# Patient Record
Sex: Female | Born: 1985 | Race: Black or African American | Hispanic: No | Marital: Single | Smoking: Never smoker
Health system: Southern US, Community
[De-identification: ages and names within clinical notes are randomized; demographics above are authoritative.]

## PROBLEM LIST (undated history)

## (undated) DIAGNOSIS — D649 Anemia, unspecified: Secondary | ICD-10-CM

## (undated) DIAGNOSIS — M1990 Unspecified osteoarthritis, unspecified site: Secondary | ICD-10-CM

## (undated) DIAGNOSIS — R011 Cardiac murmur, unspecified: Secondary | ICD-10-CM

---

## 2009-08-24 ENCOUNTER — Ambulatory Visit (HOSPITAL_COMMUNITY): Admission: RE | Admit: 2009-08-24 | Discharge: 2009-08-24 | Payer: Self-pay | Admitting: Obstetrics & Gynecology

## 2010-01-09 ENCOUNTER — Inpatient Hospital Stay (HOSPITAL_COMMUNITY): Admission: AD | Admit: 2010-01-09 | Discharge: 2010-01-11 | Payer: Self-pay | Admitting: Obstetrics

## 2010-01-12 ENCOUNTER — Ambulatory Visit: Admission: RE | Admit: 2010-01-12 | Discharge: 2010-01-12 | Payer: Self-pay | Admitting: Obstetrics & Gynecology

## 2010-02-04 ENCOUNTER — Inpatient Hospital Stay (HOSPITAL_COMMUNITY): Admission: AD | Admit: 2010-02-04 | Discharge: 2010-02-04 | Payer: Self-pay | Admitting: Obstetrics & Gynecology

## 2010-02-04 ENCOUNTER — Ambulatory Visit: Payer: Self-pay | Admitting: Obstetrics and Gynecology

## 2011-02-27 LAB — CBC
HCT: 38.6 % (ref 36.0–46.0)
Hemoglobin: 13.1 g/dL (ref 12.0–15.0)
MCHC: 33.6 g/dL (ref 30.0–36.0)
MCHC: 33.9 g/dL (ref 30.0–36.0)
MCV: 88.7 fL (ref 78.0–100.0)
MCV: 89.2 fL (ref 78.0–100.0)
MCV: 90.3 fL (ref 78.0–100.0)
Platelets: 183 10*3/uL (ref 150–400)
Platelets: 198 10*3/uL (ref 150–400)
Platelets: 227 10*3/uL (ref 150–400)
RBC: 3.96 MIL/uL (ref 3.87–5.11)
RBC: 4.3 MIL/uL (ref 3.87–5.11)
RDW: 13.3 % (ref 11.5–15.5)
RDW: 13.6 % (ref 11.5–15.5)
WBC: 7.1 10*3/uL (ref 4.0–10.5)

## 2012-08-05 ENCOUNTER — Encounter (HOSPITAL_BASED_OUTPATIENT_CLINIC_OR_DEPARTMENT_OTHER): Payer: Self-pay | Admitting: *Deleted

## 2012-08-05 ENCOUNTER — Emergency Department (HOSPITAL_BASED_OUTPATIENT_CLINIC_OR_DEPARTMENT_OTHER)
Admission: EM | Admit: 2012-08-05 | Discharge: 2012-08-05 | Disposition: A | Discharge status: Home / Self Care | Payer: Self-pay | Attending: Emergency Medicine | Admitting: Emergency Medicine

## 2012-08-05 DIAGNOSIS — R51 Headache: Secondary | ICD-10-CM | POA: Insufficient documentation

## 2012-08-05 LAB — COMPREHENSIVE METABOLIC PANEL
ALT: 11 U/L (ref 0–35)
AST: 18 U/L (ref 0–37)
CO2: 27 mEq/L (ref 19–32)
Chloride: 104 mEq/L (ref 96–112)
Creatinine, Ser: 0.7 mg/dL (ref 0.50–1.10)
GFR calc Af Amer: >90 mL/min (ref >90)
GFR calc non Af Amer: >90 mL/min (ref >90)
Glucose, Bld: 122 mg/dL — ABNORMAL HIGH (ref 70–99)
Sodium: 138 mEq/L (ref 135–145)
Total Bilirubin: 0.2 mg/dL — ABNORMAL LOW (ref 0.3–1.2)

## 2012-08-05 LAB — URINALYSIS, ROUTINE W REFLEX MICROSCOPIC
Glucose, UA: NEGATIVE mg/dL
Hgb urine dipstick: NEGATIVE
Ketones, ur: NEGATIVE mg/dL
Protein, ur: NEGATIVE mg/dL

## 2012-08-05 LAB — CBC WITH DIFFERENTIAL/PLATELET
Basophils Absolute: 0 10*3/uL (ref 0.0–0.1)
HCT: 34.9 % — ABNORMAL LOW (ref 36.0–46.0)
Lymphocytes Relative: 27 % (ref 12–46)
Lymphs Abs: 2.5 10*3/uL (ref 0.7–4.0)
MCV: 79.9 fL (ref 78.0–100.0)
Monocytes Absolute: 0.8 10*3/uL (ref 0.1–1.0)
Neutro Abs: 5.7 10*3/uL (ref 1.7–7.7)
RBC: 4.37 MIL/uL (ref 3.87–5.11)
RDW: 14.5 % (ref 11.5–15.5)
WBC: 9.2 10*3/uL (ref 4.0–10.5)

## 2012-08-05 MED ORDER — IBUPROFEN 800 MG PO TABS: 800.0000 mg | ORAL_TABLET | Freq: Three times a day (TID) | ORAL | Status: AC

## 2012-08-05 MED ORDER — SODIUM CHLORIDE 0.9 % IV BOLUS (SEPSIS)
1000.0000 mL | Freq: Once | INTRAVENOUS | Status: AC
  Administered 2012-08-05: 1000 mL via INTRAVENOUS

## 2012-08-05 NOTE — ED Notes (Signed)
Pt c/o h/a x 2 days 

## 2012-08-07 NOTE — ED Provider Notes (Signed)
History     CSN: [id number]  Arrival date & time 08/05/12  1724   First MD Initiated Contact with Patient 08/05/12 1758      Chief Complaint  Patient presents with  . Headache    (Consider location/radiation/quality/duration/timing/severity/associated sxs/prior treatment) HPI Patient is a 67 roll female who presents today with 2 days of intermittent headaches which she reports is currently 2/10. She has tried Aleve without relief at home. She denies nausea, vomiting, photophobia, phonophobia, numbness, tingling, paresthesias, or rash. Patient has had no fever. She endorses some lightheadedness. She does report some generalized malaise as well. She describes some lightheadedness but denies any chest pain or shortness of breath. Patient has continued to eat and drink well. She is concerned as the headache keeps coming back. Patient denies any abdominal pain, chest pain, cough, shortness of breath. There no other associated or modifying factors. History reviewed. No pertinent past medical history.  History reviewed. No pertinent past surgical history.  History reviewed. No pertinent family history.  History  Substance Use Topics  . Smoking status: Never Smoker   . Smokeless tobacco: Not on file  . Alcohol Use: No    OB History    Grav Para Term Preterm Abortions TAB SAB Ect Mult Living                  Review of Systems  Constitutional: Positive for fatigue.  Eyes: Negative.   Respiratory: Negative.   Cardiovascular: Negative.   Gastrointestinal: Negative.   Genitourinary: Negative.   Musculoskeletal: Negative.   Neurological: Positive for light-headedness and headaches.  Hematological: Negative.   Psychiatric/Behavioral: Negative.   All other systems reviewed and are negative.    Allergies  Review of patient's allergies indicates no known allergies.  Home Medications   Current Outpatient Rx  Name Route Sig Dispense Refill  . NAPROXEN SODIUM 220 MG PO TABS Oral  Take 440 mg by mouth 2 (two) times daily with a meal. For headache.    . IBUPROFEN 800 MG PO TABS Oral Take 1 tablet (800 mg total) by mouth 3 (three) times daily. 21 tablet 0    BP 113/59  Pulse 74  Temp 98.5 F (36.9 C) (Oral)  Resp 18  Ht 5\' 9"  (1.753 m)  Wt 216 lb (97.977 kg)  BMI 31.90 kg/m2  SpO2 100%  LMP 08/01/2012  Physical Exam  Nursing note and vitals reviewed. GEN: Well-developed, well-nourished female in no distress HEENT: Atraumatic, normocephalic. Oropharynx clear without erythema EYES: PERRLA BL, no scleral icterus. NECK: Trachea midline, no meningismus CV: regular rate and rhythm. No murmurs, rubs, or gallops PULM: No respiratory distress.  No crackles, wheezes, or rales. GI: soft, non-tender. No guarding, rebound, or tenderness. + bowel sounds  GU: deferred Neuro: cranial nerves grossly 2-12 intact, no abnormalities of strength or sensation, A and O x 3, no pronator drift, no dysmetria on finger to nose test bilaterally. MSK: Patient moves all 4 extremities symmetrically, no deformity, edema, or injury noted Skin: No rashes petechiae, purpura, or jaundice Psych: no abnormality of mood   ED Course  Procedures (including critical care time)  Labs Reviewed  CBC WITH DIFFERENTIAL - Abnormal; Notable for the following:    Hemoglobin 11.3 (*)     HCT 34.9 (*)     MCH 25.9 (*)     All other components within normal limits  COMPREHENSIVE METABOLIC PANEL - Abnormal; Notable for the following:    Glucose, Bld 122 (*)  Total Bilirubin 0.2 (*)     All other components within normal limits  LIPASE, BLOOD  URINALYSIS, ROUTINE W REFLEX MICROSCOPIC  PREGNANCY, URINE   No results found.   1. Headache       MDM  Patient was evaluated by myself. Check completely nonfocal neurologic exam and headache was only a 2/10. Patient was concerned that she had had some lightheadedness and was having recurrence of her headaches. Patient did have a urinalysis and urine  pregnancy both of which were unremarkable given her nonspecific symptoms. Additionally as she mentioned some nausea and general malaise she did have a conference of metabolic panel and lipase are unremarkable. CBC showed only a slight anemia consistent with patient may demonstrate female. Patient did receive a liter normal saline IV bolus and felt better after this. She was instructed to use ibuprofen if her headache returns. No further evaluation was necessary today. Patient was discharged in good condition in provided with resource list obtain a primary care physician to followup with.        Cyndra Numbers, MD 08/07/12 [phone]

## 2012-09-05 ENCOUNTER — Emergency Department (HOSPITAL_BASED_OUTPATIENT_CLINIC_OR_DEPARTMENT_OTHER): Payer: Self-pay

## 2012-09-05 ENCOUNTER — Encounter (HOSPITAL_BASED_OUTPATIENT_CLINIC_OR_DEPARTMENT_OTHER): Payer: Self-pay | Admitting: *Deleted

## 2012-09-05 ENCOUNTER — Ambulatory Visit (HOSPITAL_BASED_OUTPATIENT_CLINIC_OR_DEPARTMENT_OTHER)
Admission: EM | Admit: 2012-09-05 | Discharge: 2012-09-06 | Disposition: A | Discharge status: Home / Self Care | Payer: Self-pay | Attending: Surgery | Admitting: Surgery

## 2012-09-05 DIAGNOSIS — R358 Other polyuria: Secondary | ICD-10-CM | POA: Insufficient documentation

## 2012-09-05 DIAGNOSIS — K3580 Unspecified acute appendicitis: Secondary | ICD-10-CM | POA: Insufficient documentation

## 2012-09-05 DIAGNOSIS — R3589 Other polyuria: Secondary | ICD-10-CM | POA: Insufficient documentation

## 2012-09-05 DIAGNOSIS — K37 Unspecified appendicitis: Secondary | ICD-10-CM

## 2012-09-05 HISTORY — DX: Anemia, unspecified: D64.9

## 2012-09-05 LAB — URINE MICROSCOPIC-ADD ON

## 2012-09-05 LAB — URINALYSIS, ROUTINE W REFLEX MICROSCOPIC
Bilirubin Urine: NEGATIVE
Glucose, UA: NEGATIVE mg/dL
Hgb urine dipstick: NEGATIVE
Nitrite: NEGATIVE
Specific Gravity, Urine: 1.017 (ref 1.005–1.030)
pH: 7 (ref 5.0–8.0)

## 2012-09-05 LAB — COMPREHENSIVE METABOLIC PANEL
AST: 22 U/L (ref 0–37)
Albumin: 3.7 g/dL (ref 3.5–5.2)
Calcium: 9 mg/dL (ref 8.4–10.5)
Chloride: 102 mEq/L (ref 96–112)
Creatinine, Ser: 0.5 mg/dL (ref 0.50–1.10)
Total Bilirubin: 0.4 mg/dL (ref 0.3–1.2)

## 2012-09-05 LAB — CBC WITH DIFFERENTIAL/PLATELET
Basophils Absolute: 0 10*3/uL (ref 0.0–0.1)
Basophils Relative: 0 % (ref 0–1)
HCT: 38.5 % (ref 36.0–46.0)
MCHC: 33.2 g/dL (ref 30.0–36.0)
Monocytes Absolute: 0.7 10*3/uL (ref 0.1–1.0)
Neutro Abs: 12.3 10*3/uL — ABNORMAL HIGH (ref 1.7–7.7)
Platelets: 250 10*3/uL (ref 150–400)
RDW: 14.9 % (ref 11.5–15.5)

## 2012-09-05 MED ORDER — ONDANSETRON HCL 4 MG/2ML IJ SOLN
4.0000 mg | Freq: Once | INTRAMUSCULAR | Status: AC
  Administered 2012-09-05: 4 mg via INTRAVENOUS
  Filled 2012-09-05: qty 2

## 2012-09-05 MED ORDER — IOHEXOL 300 MG/ML  SOLN
100.0000 mL | Freq: Once | INTRAMUSCULAR | Status: AC | PRN
  Administered 2012-09-05: 100 mL via INTRAVENOUS

## 2012-09-05 MED ORDER — FENTANYL CITRATE 0.05 MG/ML IJ SOLN
50.0000 ug | Freq: Once | INTRAMUSCULAR | Status: AC
  Administered 2012-09-05: 50 ug via INTRAVENOUS
  Filled 2012-09-05: qty 2

## 2012-09-05 MED ORDER — GI COCKTAIL ~~LOC~~
30.0000 mL | Freq: Once | ORAL | Status: AC
  Administered 2012-09-05: 30 mL via ORAL
  Filled 2012-09-05: qty 30

## 2012-09-05 MED ORDER — PIPERACILLIN-TAZOBACTAM 3.375 G IVPB 30 MIN
3.3750 g | Freq: Once | INTRAVENOUS | Status: AC
  Administered 2012-09-05: 3.375 g via INTRAVENOUS
  Filled 2012-09-05 (×2): qty 50

## 2012-09-05 MED ORDER — IOHEXOL 300 MG/ML  SOLN
40.0000 mL | Freq: Once | INTRAMUSCULAR | Status: AC | PRN
  Administered 2012-09-05: 40 mL via ORAL

## 2012-09-05 NOTE — ED Notes (Signed)
[facility] called to transport pt to [facility] ED, RN notified.

## 2012-09-05 NOTE — ED Notes (Signed)
Upper abd pain onset last p.m. +nausea. Vomited x 1. Last BM today. "Had to force it out"

## 2012-09-05 NOTE — ED Provider Notes (Signed)
History  This chart was scribed for Loghan Subia Smitty Cords, MD by Shari Heritage. The patient was seen in room MH08/MH08. Patient's care was started at 1920.     CSN: [id number]  Arrival date & time 09/05/12  1831   First MD Initiated Contact with Patient 09/05/12 1920      Chief Complaint  Patient presents with  . Abdominal Pain     Patient is a 26 y.o. female presenting with abdominal pain. The history is provided by the patient. No language interpreter was used.  Abdominal Pain The primary symptoms of the illness include abdominal pain, nausea and vomiting. The primary symptoms of the illness do not include diarrhea or dysuria. The current episode started yesterday. The onset of the illness was sudden. The problem has been gradually worsening.  The abdominal pain began yesterday. The pain came on suddenly. The abdominal pain has been gradually worsening since its onset. The abdominal pain is located in the epigastric region. The abdominal pain is relieved by nothing.  Associated with: none. The patient states that she believes she is currently not pregnant. The patient has not had a change in bowel habit. Risk factors: none. Additional symptoms associated with the illness include frequency. Symptoms associated with the illness do not include chills, anorexia, diaphoresis, heartburn, constipation or hematuria. Significant associated medical issues do not include GERD.    MALANII PRUNEAU is a 26 y.o. female who presents to the Emergency Department complaining of sudden, gradually worsening, moderate to severe, epigastric abdominal pain onset 24 hours ago. She says that few hours ago, the pain moved to the RLQ. There is associated nausea, vomiting 3x and polyuria. Patient denies diarrhea and dysuria. She states that she has been belching a lot and initially thought the pain was reflux. Patient denies history of GERD or abdominal surgeries.  She has never smoked.  History reviewed. No pertinent  past medical history.  History reviewed. No pertinent past surgical history.  History reviewed. No pertinent family history.  History  Substance Use Topics  . Smoking status: Never Smoker   . Smokeless tobacco: Not on file  . Alcohol Use: No    OB History    Grav Para Term Preterm Abortions TAB SAB Ect Mult Living                  Review of Systems  Constitutional: Negative.  Negative for chills and diaphoresis.  HENT: Negative.   Eyes: Negative.   Respiratory: Negative.   Gastrointestinal: Positive for nausea, vomiting and abdominal pain. Negative for heartburn, diarrhea, constipation and anorexia.  Genitourinary: Positive for frequency. Negative for dysuria and hematuria.  Musculoskeletal: Negative.   Skin: Negative.   Neurological: Negative.   Psychiatric/Behavioral: Negative.   All other systems reviewed and are negative.    Allergies  Review of patient's allergies indicates no known allergies.  Home Medications   Current Outpatient Rx  Name Route Sig Dispense Refill  . NAPROXEN SODIUM 220 MG PO TABS Oral Take 440 mg by mouth 2 (two) times daily with a meal. For headache.      BP 129/70  Pulse 72  Temp 98 F (36.7 C) (Oral)  Resp 20  Ht 5\' 9"  (1.753 m)  Wt 220 lb (99.791 kg)  BMI 32.49 kg/m2  SpO2 100%  LMP 08/19/2012  Physical Exam  Constitutional: She is oriented to person, place, and time. She appears well-developed and well-nourished.  HENT:  Head: Normocephalic and atraumatic.  Mouth/Throat: Uvula is midline, oropharynx  is clear and moist and mucous membranes are normal. No oropharyngeal exudate.  Eyes: Pupils are equal, round, and reactive to light.  Neck: Normal range of motion. Neck supple.  Cardiovascular: Normal rate and regular rhythm.   Pulmonary/Chest: Effort normal and breath sounds normal. No respiratory distress. She has no wheezes. She has no rales.  Abdominal: Soft. Normal appearance. She exhibits no distension. Bowel sounds are  increased. There is tenderness in the epigastric area. There is no rebound and no guarding.       Hyperactive bowel sounds.  Musculoskeletal: Normal range of motion.  Neurological: She is alert and oriented to person, place, and time.  Skin: Skin is warm and dry.  Psychiatric: She has a normal mood and affect. Her behavior is normal.    ED Course  Procedures (including critical care time) DIAGNOSTIC STUDIES: Oxygen Saturation is 100% on room air, normal by my interpretation.    COORDINATION OF CARE: 7:30pm- Patient informed of current plan for treatment and evaluation and agrees with plan at this time.    Results for orders placed during the hospital encounter of 09/05/12  URINALYSIS, ROUTINE W REFLEX MICROSCOPIC      Component Value Range   Color, Urine YELLOW  YELLOW   APPearance TURBID (*) CLEAR   Specific Gravity, Urine 1.017  1.005 - 1.030   pH 7.0  5.0 - 8.0   Glucose, UA NEGATIVE  NEGATIVE mg/dL   Hgb urine dipstick NEGATIVE  NEGATIVE   Bilirubin Urine NEGATIVE  NEGATIVE   Ketones, ur 15 (*) NEGATIVE mg/dL   Protein, ur NEGATIVE  NEGATIVE mg/dL   Urobilinogen, UA 0.2  0.0 - 1.0 mg/dL   Nitrite NEGATIVE  NEGATIVE   Leukocytes, UA TRACE (*) NEGATIVE  PREGNANCY, URINE      Component Value Range   Preg Test, Ur NEGATIVE  NEGATIVE  URINE MICROSCOPIC-ADD ON      Component Value Range   Squamous Epithelial / LPF MANY (*) RARE   WBC, UA 0-2  <3 WBC/hpf   RBC / HPF 0-2  <3 RBC/hpf   Bacteria, UA FEW (*) RARE   Urine-Other AMORPHOUS URATES/PHOSPHATES    CBC WITH DIFFERENTIAL      Component Value Range   WBC 14.1 (*) 4.0 - 10.5 K/uL   RBC 4.85  3.87 - 5.11 MIL/uL   Hemoglobin 12.8  12.0 - 15.0 g/dL   HCT 16.1  09.6 - 04.5 %   MCV 79.4  78.0 - 100.0 fL   MCH 26.4  26.0 - 34.0 pg   MCHC 33.2  30.0 - 36.0 g/dL   RDW 40.9  81.1 - 91.4 %   Platelets 250  150 - 400 K/uL   Neutrophils Relative 87 (*) 43 - 77 %   Neutro Abs 12.3 (*) 1.7 - 7.7 K/uL   Lymphocytes Relative 8  (*) 12 - 46 %   Lymphs Abs 1.1  0.7 - 4.0 K/uL   Monocytes Relative 5  3 - 12 %   Monocytes Absolute 0.7  0.1 - 1.0 K/uL   Eosinophils Relative 0  0 - 5 %   Eosinophils Absolute 0.0  0.0 - 0.7 K/uL   Basophils Relative 0  0 - 1 %   Basophils Absolute 0.0  0.0 - 0.1 K/uL  COMPREHENSIVE METABOLIC PANEL      Component Value Range   Sodium 133 (*) 135 - 145 mEq/L   Potassium 3.8  3.5 - 5.1 mEq/L   Chloride 102  96 -  112 mEq/L   CO2 17 (*) 19 - 32 mEq/L   Glucose, Bld 109 (*) 70 - 99 mg/dL   BUN 8  6 - 23 mg/dL   Creatinine, Ser 4.09  0.50 - 1.10 mg/dL   Calcium 9.0  8.4 - 81.1 mg/dL   Total Protein 7.3  6.0 - 8.3 g/dL   Albumin 3.7  3.5 - 5.2 g/dL   AST 22  0 - 37 U/L   ALT 9  0 - 35 U/L   Alkaline Phosphatase 63  39 - 117 U/L   Total Bilirubin 0.4  0.3 - 1.2 mg/dL   GFR calc non Af Amer >90  >90 mL/min   GFR calc Af Amer >90  >90 mL/min  LIPASE, BLOOD      Component Value Range   Lipase 28  11 - 59 U/L     No results found.   No diagnosis found.    MDM  Will transfer to [facility] for appy.  Zosyn given    I personally performed the services described in this documentation, which was scribed in my presence. The recorded information has been reviewed and considered.     Jasmine Awe, MD 09/06/12 [phone]

## 2012-09-06 ENCOUNTER — Encounter (HOSPITAL_COMMUNITY): Payer: Self-pay | Admitting: *Deleted

## 2012-09-06 ENCOUNTER — Encounter (HOSPITAL_COMMUNITY): Payer: Self-pay

## 2012-09-06 ENCOUNTER — Emergency Department (HOSPITAL_COMMUNITY): Payer: Self-pay | Admitting: *Deleted

## 2012-09-06 ENCOUNTER — Encounter (HOSPITAL_COMMUNITY)
Admission: EM | Disposition: A | Discharge status: Home / Self Care | Payer: Self-pay | Source: Home / Self Care | Attending: Emergency Medicine

## 2012-09-06 DIAGNOSIS — K3580 Unspecified acute appendicitis: Secondary | ICD-10-CM

## 2012-09-06 HISTORY — PX: LAPAROSCOPIC APPENDECTOMY: SHX408

## 2012-09-06 SURGERY — APPENDECTOMY, LAPAROSCOPIC
Anesthesia: General | Site: Abdomen | Wound class: Clean Contaminated

## 2012-09-06 MED ORDER — HYDROMORPHONE HCL PF 1 MG/ML IJ SOLN
0.2500 mg | INTRAMUSCULAR | Status: DC | PRN
  Administered 2012-09-06 (×2): 0.5 mg via INTRAVENOUS

## 2012-09-06 MED ORDER — LACTATED RINGERS IR SOLN
Status: DC | PRN
  Administered 2012-09-06: 1000 mL

## 2012-09-06 MED ORDER — LIDOCAINE HCL (CARDIAC) 20 MG/ML IV SOLN
INTRAVENOUS | Status: DC | PRN
  Administered 2012-09-06: 75 mg via INTRAVENOUS

## 2012-09-06 MED ORDER — DEXTROSE 5 % IV SOLN
1.0000 g | Freq: Two times a day (BID) | INTRAVENOUS | Status: DC
  Administered 2012-09-06: 1 g via INTRAVENOUS
  Filled 2012-09-06 (×2): qty 1

## 2012-09-06 MED ORDER — LIP MEDEX EX OINT
TOPICAL_OINTMENT | CUTANEOUS | Status: AC
  Administered 2012-09-06: 07:00:00
  Filled 2012-09-06: qty 7

## 2012-09-06 MED ORDER — DEXAMETHASONE SODIUM PHOSPHATE 10 MG/ML IJ SOLN
INTRAMUSCULAR | Status: DC | PRN
  Administered 2012-09-06: 10 mg via INTRAVENOUS

## 2012-09-06 MED ORDER — ONDANSETRON HCL 4 MG/2ML IJ SOLN
INTRAMUSCULAR | Status: DC | PRN
  Administered 2012-09-06 (×2): 2 mg via INTRAVENOUS

## 2012-09-06 MED ORDER — HYDROMORPHONE HCL PF 1 MG/ML IJ SOLN
INTRAMUSCULAR | Status: AC
  Filled 2012-09-06: qty 1

## 2012-09-06 MED ORDER — PROMETHAZINE HCL 25 MG/ML IJ SOLN: 6.2500 mg | INTRAMUSCULAR | Status: DC | PRN

## 2012-09-06 MED ORDER — MORPHINE SULFATE 2 MG/ML IJ SOLN: 1.0000 mg | INTRAMUSCULAR | Status: DC | PRN

## 2012-09-06 MED ORDER — NEOSTIGMINE METHYLSULFATE 1 MG/ML IJ SOLN
INTRAMUSCULAR | Status: DC | PRN
  Administered 2012-09-06: 1 mg via INTRAVENOUS

## 2012-09-06 MED ORDER — ACETAMINOPHEN 10 MG/ML IV SOLN
INTRAVENOUS | Status: AC
  Filled 2012-09-06: qty 100

## 2012-09-06 MED ORDER — PROPOFOL 10 MG/ML IV EMUL
INTRAVENOUS | Status: DC | PRN
  Administered 2012-09-06: 50 mg via INTRAVENOUS
  Administered 2012-09-06: 150 mg via INTRAVENOUS

## 2012-09-06 MED ORDER — SUCCINYLCHOLINE CHLORIDE 20 MG/ML IJ SOLN
INTRAMUSCULAR | Status: DC | PRN
  Administered 2012-09-06: 100 mg via INTRAVENOUS

## 2012-09-06 MED ORDER — ONDANSETRON HCL 4 MG PO TABS: 4.0000 mg | ORAL_TABLET | Freq: Four times a day (QID) | ORAL | Status: DC | PRN

## 2012-09-06 MED ORDER — LACTATED RINGERS IV SOLN
INTRAVENOUS | Status: DC | PRN
  Administered 2012-09-06 (×2): via INTRAVENOUS

## 2012-09-06 MED ORDER — KCL IN DEXTROSE-NACL 20-5-0.45 MEQ/L-%-% IV SOLN
INTRAVENOUS | Status: DC
  Administered 2012-09-06: 08:00:00 via INTRAVENOUS
  Filled 2012-09-06: qty 1000

## 2012-09-06 MED ORDER — IBUPROFEN 600 MG PO TABS
600.0000 mg | ORAL_TABLET | Freq: Four times a day (QID) | ORAL | Status: DC | PRN
  Filled 2012-09-06: qty 1

## 2012-09-06 MED ORDER — BUPIVACAINE HCL (PF) 0.25 % IJ SOLN
INTRAMUSCULAR | Status: AC
  Filled 2012-09-06: qty 30

## 2012-09-06 MED ORDER — ACETAMINOPHEN 10 MG/ML IV SOLN
INTRAVENOUS | Status: DC | PRN
  Administered 2012-09-06: 1000 mg via INTRAVENOUS

## 2012-09-06 MED ORDER — HYDROCODONE-ACETAMINOPHEN 5-325 MG PO TABS
1.0000 | ORAL_TABLET | ORAL | Status: DC | PRN
  Administered 2012-09-06: 2 via ORAL
  Filled 2012-09-06: qty 2

## 2012-09-06 MED ORDER — HEPARIN SODIUM (PORCINE) 5000 UNIT/ML IJ SOLN
5000.0000 [IU] | Freq: Three times a day (TID) | INTRAMUSCULAR | Status: DC
  Administered 2012-09-06: 5000 [IU] via SUBCUTANEOUS
  Filled 2012-09-06 (×3): qty 1

## 2012-09-06 MED ORDER — FENTANYL CITRATE 0.05 MG/ML IJ SOLN
INTRAMUSCULAR | Status: DC | PRN
  Administered 2012-09-06 (×5): 50 ug via INTRAVENOUS

## 2012-09-06 MED ORDER — MORPHINE SULFATE 4 MG/ML IJ SOLN
6.0000 mg | Freq: Once | INTRAMUSCULAR | Status: AC
  Administered 2012-09-06: 6 mg via INTRAVENOUS
  Filled 2012-09-06: qty 2

## 2012-09-06 MED ORDER — BUPIVACAINE HCL (PF) 0.25 % IJ SOLN
INTRAMUSCULAR | Status: DC | PRN
  Administered 2012-09-06: 30 mL

## 2012-09-06 MED ORDER — MIDAZOLAM HCL 5 MG/5ML IJ SOLN
INTRAMUSCULAR | Status: DC | PRN
  Administered 2012-09-06: 1 mg via INTRAVENOUS

## 2012-09-06 MED ORDER — GLYCOPYRROLATE 0.2 MG/ML IJ SOLN
INTRAMUSCULAR | Status: DC | PRN
  Administered 2012-09-06: 0.2 mg via INTRAVENOUS

## 2012-09-06 MED ORDER — ONDANSETRON HCL 4 MG/2ML IJ SOLN: 4.0000 mg | Freq: Four times a day (QID) | INTRAMUSCULAR | Status: DC | PRN

## 2012-09-06 MED ORDER — CISATRACURIUM BESYLATE (PF) 10 MG/5ML IV SOLN
INTRAVENOUS | Status: DC | PRN
  Administered 2012-09-06: 4 mg via INTRAVENOUS

## 2012-09-06 MED ORDER — HYDROCODONE-ACETAMINOPHEN 5-325 MG PO TABS: 1.0000 | ORAL_TABLET | ORAL | Status: DC | PRN

## 2012-09-06 SURGICAL SUPPLY — 37 items
APPLIER CLIP ROT 10 11.4 M/L (STAPLE)
BENZOIN TINCTURE PRP APPL 2/3 (GAUZE/BANDAGES/DRESSINGS) IMPLANT
CANISTER SUCTION 2500CC (MISCELLANEOUS) ×2 IMPLANT
CLIP APPLIE ROT 10 11.4 M/L (STAPLE) IMPLANT
CLOTH BEACON ORANGE TIMEOUT ST (SAFETY) ×2 IMPLANT
CUTTER FLEX LINEAR 45M (STAPLE) ×2 IMPLANT
DECANTER SPIKE VIAL GLASS SM (MISCELLANEOUS) ×2 IMPLANT
DERMABOND ADVANCED (GAUZE/BANDAGES/DRESSINGS) ×1
DERMABOND ADVANCED .7 DNX12 (GAUZE/BANDAGES/DRESSINGS) ×1 IMPLANT
DRAPE LAPAROSCOPIC ABDOMINAL (DRAPES) ×2 IMPLANT
ELECT REM PT RETURN 9FT ADLT (ELECTROSURGICAL) ×2
ELECTRODE REM PT RTRN 9FT ADLT (ELECTROSURGICAL) ×1 IMPLANT
ENDOLOOP SUT PDS II  0 18 (SUTURE)
ENDOLOOP SUT PDS II 0 18 (SUTURE) IMPLANT
GLOVE BIOGEL PI IND STRL 7.0 (GLOVE) ×1 IMPLANT
GLOVE BIOGEL PI INDICATOR 7.0 (GLOVE) ×1
GLOVE SURG SIGNA 7.5 PF LTX (GLOVE) ×2 IMPLANT
GOWN STRL NON-REIN LRG LVL3 (GOWN DISPOSABLE) ×2 IMPLANT
GOWN STRL REIN XL XLG (GOWN DISPOSABLE) ×4 IMPLANT
KIT BASIN OR (CUSTOM PROCEDURE TRAY) ×2 IMPLANT
PENCIL BUTTON HOLSTER BLD 10FT (ELECTRODE) IMPLANT
POUCH SPECIMEN RETRIEVAL 10MM (ENDOMECHANICALS) ×2 IMPLANT
RELOAD 45 VASCULAR/THIN (ENDOMECHANICALS) IMPLANT
RELOAD STAPLE TA45 3.5 REG BLU (ENDOMECHANICALS) ×2 IMPLANT
SCALPEL HARMONIC ACE (MISCELLANEOUS) ×2 IMPLANT
SET IRRIG TUBING LAPAROSCOPIC (IRRIGATION / IRRIGATOR) ×2 IMPLANT
SOLUTION ANTI FOG 6CC (MISCELLANEOUS) ×2 IMPLANT
STRIP CLOSURE SKIN 1/4X3 (GAUZE/BANDAGES/DRESSINGS) IMPLANT
SUT MON AB 5-0 PS2 18 (SUTURE) ×2 IMPLANT
SUT VIC AB 5-0 PS2 18 (SUTURE) ×2 IMPLANT
TOWEL OR 17X26 10 PK STRL BLUE (TOWEL DISPOSABLE) ×2 IMPLANT
TRAY FOLEY CATH 14FRSI W/METER (CATHETERS) ×2 IMPLANT
TRAY LAP CHOLE (CUSTOM PROCEDURE TRAY) ×2 IMPLANT
TROCAR XCEL BLUNT TIP 100MML (ENDOMECHANICALS) ×2 IMPLANT
TROCAR Z-THREAD FIOS 11X100 BL (TROCAR) ×2 IMPLANT
TROCAR Z-THREAD FIOS 5X100MM (TROCAR) ×2 IMPLANT
TUBING INSUFFLATION 10FT LAP (TUBING) ×2 IMPLANT

## 2012-09-06 NOTE — Transfer of Care (Signed)
Immediate Anesthesia Transfer of Care Note  Patient: Kathryn Hardy  Procedure(s) Performed: Procedure(s) (LRB) with comments: APPENDECTOMY LAPAROSCOPIC (N/A)  Patient Location: PACU  Anesthesia Type: General  Level of Consciousness: awake, alert , oriented and patient cooperative  Airway & Oxygen Therapy: Patient Spontanous Breathing and Patient connected to face mask oxygen  Post-op Assessment: Report given to PACU RN, Post -op Vital signs reviewed and stable and Patient moving all extremities  Post vital signs: Reviewed and stable  Complications: No apparent anesthesia complications

## 2012-09-06 NOTE — Progress Notes (Signed)
Pt tolerated clear breakfast. Ordered regular lunch per previous order to advance as tolerated. Pt stated if she feels comfortable after lunch will go home. I will contact Dr Maisie Fus with updates on patient. Lurena Joiner, RN

## 2012-09-06 NOTE — Anesthesia Postprocedure Evaluation (Signed)
  Anesthesia Post-op Note  Patient: Kathryn Hardy  Procedure(s) Performed: Procedure(s) (LRB) with comments: APPENDECTOMY LAPAROSCOPIC (N/A)  Patient Location: PACU  Anesthesia Type: General  Level of Consciousness: awake, alert , oriented and patient cooperative  Airway and Oxygen Therapy: Patient Spontanous Breathing and Patient connected to nasal cannula oxygen  Post-op Pain: mild  Post-op Assessment: Post-op Vital signs reviewed, Patient's Cardiovascular Status Stable, Respiratory Function Stable, No signs of Nausea or vomiting and Pain level controlled  Post-op Vital Signs: Reviewed and stable  Complications: No apparent anesthesia complications

## 2012-09-06 NOTE — H&P (Signed)
Re:   Kathryn Hardy DOB:   30-Jul-1986 MRN:   [id number]  ASSESSMENT AND PLAN: 1.  Appendicitis.  I discussed with the patient the indications and risks of appendiceal surgery.  The primary risks of appendiceal surgery include, but are not limited to, bleeding, infection, bowel surgery, and open surgery.  There is also the risk that the patient may have continued symptoms after surgery.  However, the likelihood of improvement in symptoms and return to the patient's normal status is good. We discussed the typical post-operative recovery course.   I tried to answer the patient's questions.  Chief Complaint  Patient presents with  . Abdominal Pain   REFERRING PHYSICIAN:  Dr. Francis Gaines, [facility]  HISTORY OF PRESENT ILLNESS: Kathryn Hardy is a 26 y.o. (DOB: 09/03/1986)  AA female whose primary care physician is No primary provider on file. and came to [facility] with abdominal pain.  The evaluation revealed probable appendicitis and she was transferred to [facility] for me to see.  Her sister, Karn Pickler, is with her.  The patient's symptoms began Friday, 9/27, when she starting urinating every hour.  She then developed a vague abdominal pain and started vomiting.  The pain was more in the epigastrium and she went to [facility]. Around 6 PM on Saturday, 9/28.  She was evaluated there by Dr. Nicanor Alcon and I was called.  She has no history of stomach disease.  No history of liver disease.  No history of gall bladder disease.  No history of pancreas disease.  No history of colon disease.  She has had no prior surgery.   CT scan- 09/05/2012 - suspicious for appendicitis- Dr. Richarda Overlie   History reviewed. No pertinent past medical history.   History reviewed. No pertinent past surgical history.    Current Facility-Administered Medications  Medication Dose Route Frequency Provider Last Rate Last Dose  . fentaNYL (SUBLIMAZE) injection 50 mcg  50 mcg  Intravenous Once April K Palumbo-Rasch, MD   50 mcg at 09/05/12 2344  . gi cocktail (Maalox,Lidocaine,Donnatal)  30 mL Oral Once April K Palumbo-Rasch, MD   30 mL at 09/05/12 2014  . iohexol (OMNIPAQUE) 300 MG/ML solution 100 mL  100 mL Intravenous Once PRN April K Palumbo-Rasch, MD   100 mL at 09/05/12 2229  . iohexol (OMNIPAQUE) 300 MG/ML solution 40 mL  40 mL Oral Once PRN April K Palumbo-Rasch, MD   40 mL at 09/05/12 2121  . morphine 4 MG/ML injection 6 mg  6 mg Intravenous Once Angus Seller, PA   6 mg at 09/06/12 0122  . ondansetron (ZOFRAN) injection 4 mg  4 mg Intravenous Once April K Palumbo-Rasch, MD   4 mg at 09/05/12 2014  . piperacillin-tazobactam (ZOSYN) IVPB 3.375 g  3.375 g Intravenous Once April K Palumbo-Rasch, MD   3.375 g at 09/05/12 2326   Current Outpatient Prescriptions  Medication Sig Dispense Refill  . naproxen sodium (ANAPROX) 220 MG tablet Take 440 mg by mouth 2 (two) times daily with a meal. For headache.         No Known Allergies  REVIEW OF SYSTEMS: Skin:  No history of rash.  No history of abnormal moles. Infection:  No history of hepatitis or HIV.  No history of MRSA. Neurologic:  No history of stroke.  No history of seizure.  No history of headaches. Cardiac:  No history of hypertension. No history of heart disease.  No  history of prior cardiac catheterization.  No history of seeing a cardiologist. Pulmonary:  Does not smoke cigarettes.  No asthma or bronchitis.  No OSA/CPAP.  Endocrine:  No diabetes. No thyroid disease. Gastrointestinal:  See HPI. Urologic:  No history of kidney stones.  No history of bladder infections. Musculoskeletal:  No history of joint or back disease. Hematologic:  No bleeding disorder.  No history of anemia.  Not anticoagulated. Psycho-social:  The patient is oriented.   The patient has no obvious psychologic or social impairment to understanding our conversation and plan.  SOCIAL and FAMILY HISTORY: Not married. Has 2 year  old daughter. 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 Sister, Stanfield, is with her.  Her mother lives in town, but just had a double lung transplant.  Her father lives in [place].  PHYSICAL EXAM: BP 135/71  Pulse 71  Temp 98.3 F (36.8 C) (Oral)  Resp 18  Ht 5\' 9"  (1.753 m)  Wt 220 lb (99.791 kg)  BMI 32.49 kg/m2  SpO2 100%  LMP 08/19/2012  General: AA F who is moderately overweight who is alert and generally healthy appearing.  HEENT: Normal. Pupils equal. Neck: Supple. No mass.  No thyroid mass. Lymph Nodes:  No supraclavicular or cervical nodes. Lungs: Clear to auscultation and symmetric breath sounds. Heart:  RRR. No murmur or rub.  Abdomen: Soft. No mass.  No hernia. Normal bowel sounds.  No abdominal scars.  She is mildly tender in her RLQ, but she does not have an impressive PE. Rectal: Not done. Extremities:  Good strength and ROM  in upper and lower extremities. Neurologic:  Grossly intact to motor and sensory function. Psychiatric: Has normal mood and affect. Behavior is normal.   DATA REVIEWED: WBC - 14,100 - 09/05/2012 Lipase - 28 - 09/05/2012  Ovidio Kin, MD,  [facility] [address]  [address]   [place], [place]    [address] Phone:  [phone] FAX:  [phone]

## 2012-09-06 NOTE — Preoperative (Signed)
Beta Blockers   Reason not to administer Beta Blockers:Not Applicable 

## 2012-09-06 NOTE — Progress Notes (Signed)
Pt discharged to home with sister provided discharge instructions and prescriptions along with handouts. Pt verbalized understanding of discharge information. Pt stable. Pt transported by [facility] IV removed and documented. Annitta Needs, RN

## 2012-09-06 NOTE — ED Notes (Signed)
AOZ:[id number]<QI> Expected date:09/05/12<BR> Expected time:11:31 PM<BR> Means of arrival:Ambulance<BR> Comments:<BR> Transfer from [facility]: [facility]

## 2012-09-06 NOTE — Anesthesia Procedure Notes (Signed)
Procedure Name: Intubation Date/Time: 09/06/2012 3:52 AM Performed by: Edison Pace Pre-anesthesia Checklist: Patient identified, Timeout performed, Emergency Drugs available, Suction available and Patient being monitored Patient Re-evaluated:Patient Re-evaluated prior to inductionOxygen Delivery Method: Circle system utilized Preoxygenation: Pre-oxygenation with 100% oxygen Intubation Type: Rapid sequence, IV induction and Cricoid Pressure applied Grade View: Grade I Tube type: Oral Tube size: 7.5 mm Number of attempts: 1 Airway Equipment and Method: Stylet

## 2012-09-06 NOTE — Anesthesia Preprocedure Evaluation (Signed)
Anesthesia Evaluation  Patient identified by MRN, date of birth, ID band Patient awake    Reviewed: Allergy & Precautions, H&P , NPO status , Patient's Chart, lab work & pertinent test results  Airway Mallampati: II TM Distance: >3 FB Neck ROM: Full    Dental No notable dental hx.    Pulmonary neg pulmonary ROS,  breath sounds clear to auscultation  Pulmonary exam normal       Cardiovascular negative cardio ROS  Rhythm:Regular Rate:Normal     Neuro/Psych negative neurological ROS  negative psych ROS   GI/Hepatic negative GI ROS, Neg liver ROS,   Endo/Other  negative endocrine ROS  Renal/GU negative Renal ROS  negative genitourinary   Musculoskeletal negative musculoskeletal ROS (+)   Abdominal   Peds negative pediatric ROS (+)  Hematology negative hematology ROS (+)   Anesthesia Other Findings   Reproductive/Obstetrics negative OB ROS                           Anesthesia Physical Anesthesia Plan  ASA: I and Emergent  Anesthesia Plan: General   Post-op Pain Management:    Induction: Intravenous  Airway Management Planned: Oral ETT  Additional Equipment:   Intra-op Plan:   Post-operative Plan: Extubation in OR  Informed Consent: I have reviewed the patients History and Physical, chart, labs and discussed the procedure including the risks, benefits and alternatives for the proposed anesthesia with the patient or authorized representative who has indicated his/her understanding and acceptance.   Dental advisory given  Plan Discussed with: CRNA  Anesthesia Plan Comments:         Anesthesia Quick Evaluation  

## 2012-09-06 NOTE — ED Notes (Signed)
Received pt. From from  [facility] per [facility], alert and oriented x 3.

## 2012-09-06 NOTE — Op Note (Signed)
Re:   Kathryn Hardy DOB:   January 19, 1986 MRN:   [id number]                   FACILITY:  [facility]     DATE OF PROCEDURE: 09/06/2012                              OPERATIVE REPORT  PREOPERATIVE DIAGNOSIS:  Appendicitis  POSTOPERATIVE DIAGNOSIS:  Acute appendicitis.  Giant appendix.  [photos taken]  PROCEDURE:  Laparoscopic appendectomy.  SURGEON:  Kathryn Bales. Ezzard Standing, MD  ASSISTANT:  Kathryn first assistant.  ANESTHESIA:  General endotracheal.  Kathryn Hardy - CRNA Kathryn Der, MD - Anesthesiologist  ESTIMATED BLOOD LOSS:  Minimal.  DRAINS: none   SPECIMEN:   Appendix  COUNTS CORRECT:  YES  INDICATIONS FOR PROCEDURE: Kathryn Hardy is a 26 y.o. (DOB: Feb 12, 1986) AA female whose primary care doctor is Kathryn Hardy on file. and comes to the OR for an appendectomy.   I discussed with the patient, the indications and potential complications of appendiceal surgery.  The potential complications include, but are not limited to, bleeding, open surgery, bowel resection, and the possibility of another diagnosis.  OPERATIVE NOTE:  The patient underwent a general endotracheal anesthetic as supervised by Kathryn Hardy - CRNA, Kathryn Der, MD - Anesthesiologist,  in room #1.  The patient was given Zosyn in the ER and the abdomen was prepped with ChloraPrep.  The patient had a foley catheter placed at the beginning of the procedure.  A time-out was held and surgical checklist run.  An infraumbilical incision was made with sharp dissection carried down to the abdominal cavity.  A 0 degree 10 mm laparoscope was inserted through a 12 mm Hasson trocar and the Hasson trocar secured with a 0 Vicryl suture.  I placed a 5 mm trocar in the right upper quadrant and 11 mm torcar in left lower quadrant and did abdominal exploration.    The right and left lobes of liver unremarkable.  Stomach was unremarkable.  The pelvic organs were unremarkable.  It did look like she has a small ruptured right ovarian  cyst.  I saw Kathryn other intra-abdominal abnormality.  The patient had appendicitis with the appendix located down the right pelvis.  The appendix was unusually large.  Photos were taken and placed in the chart.  The mesentery of the appendix was divided with a Harmonic scalpel.  There was also peritoneum fusing the terminal ileum with the base of the appendix, but this separated with dissection.  I got to the base of the appendix.  I then used a blue load 45 mm Ethicon Endo-GIA stapler and fired this across the base of the appendix.  I placed the appendix in EndoCatch bag and delivered the bag through the umbilical incision.  I irrigated the abdomen with 300 cc of saline.  After irrigating the abdomen, I then removed the trocars, in turn.  The umbilical port fascia was closed with 0 Vicryl suture.   I closed the skin each site with a 5-0 Vicryl suture and painted the wounds with Dermabond.  I then injected a total of 30 mL of 0.25% Marcaine at the incisions.  Sponge and needle count were correct at the end of the case.  The foley catheter was removed in the OR.  The patient was transferred to the recovery room in good condition.  The patient tolerated  the procedure well and it depends on the patient's post op clinical course as to when she could be discharged.   Ovidio Kin, MD, [facility] Pager: [phone] Office phone:  [phone]

## 2012-09-06 NOTE — Progress Notes (Signed)
Day of Surgery  Subjective: Pt is s/p l/s appy several hrs ago.  Feeling well.  There is a small amt of pain around her umbilicus.  No nausea.  Objective: Vital signs in last 24 hours: Temp:  [97.5 F (36.4 C)-98.7 F (37.1 C)] 98 F (36.7 C) (09/29 0814) Pulse Rate:  [63-73] 73  (09/29 0814) Resp:  [10-20] 16  (09/29 0814) BP: (123-138)/(59-86) 129/82 mmHg (09/29 0814) SpO2:  [98 %-100 %] 100 % (09/29 0814) Weight:  [220 lb (99.791 kg)-225 lb 12 oz (102.4 kg)] 225 lb 12 oz (102.4 kg) (09/29 0622)   Intake/Output from previous day: 09/28 0701 - 09/29 0700 In: 1600 [I.V.:1600] Out: 525 [Urine:250] Intake/Output this shift:     General appearance: alert, cooperative and no distress GI: soft, appropriately tender  Incision: no significant drainage, no dehiscence  Lab Results:   Basename 09/05/12 1950  WBC 14.1*  HGB 12.8  HCT 38.5  PLT 250   BMET  Basename 09/05/12 1950  NA 133*  K 3.8  CL 102  CO2 17*  GLUCOSE 109*  BUN 8  CREATININE 0.50  CALCIUM 9.0   PT/INR No results found for this basename: LABPROT:2,INR:2 in the last 72 hours ABG No results found for this basename: PHART:2,PCO2:2,PO2:2,HCO3:2 in the last 72 hours  MEDS, Scheduled    . cefoTEtan (CEFOTAN) IV  1 g Intravenous Q12H  . fentaNYL  50 mcg Intravenous Once  . gi cocktail  30 mL Oral Once  . heparin  5,000 Units Subcutaneous Q8H  . HYDROmorphone      . lip balm      .  morphine injection  6 mg Intravenous Once  . ondansetron (ZOFRAN) IV  4 mg Intravenous Once  . piperacillin-tazobactam  3.375 g Intravenous Once    Studies/Results: Ct Abdomen Pelvis W Contrast  09/05/2012  *RADIOLOGY REPORT*  Clinical Data: Epigastric abdominal pain.  CT ABDOMEN AND PELVIS WITH CONTRAST  Technique:  Multidetector CT imaging of the abdomen and pelvis was performed following the standard protocol during bolus administration of intravenous contrast.  Contrast: OMNIPAQUE IOHEXOL 300 MG/ML  SOLN   Comparison: None.  Findings: Lung bases are clear.  There is mild atelectasis in the posterior left lower lobe.  There is no evidence for free intraperitoneal air.  Normal appearance of the liver, gallbladder, portal venous system, spleen, adrenal glands, kidneys and pancreas.  There is laxity along the abdominal wall at the umbilicus.  There is a large amount stool in the rectum.  No acute bony abnormality.  There is fluid in the urinary bladder. Limited evaluation of the uterus and adnexal structures. Endometrial appears to be prominent.  There is a dilated blind ending loop in the right lower quadrant which may have a small amount of edema or stranding around it. This is best seen on the sagittal reformats on sequence #6, image 57.  There is a peripherally dense or calcification in the proximal aspect of the tube that measures up to 1.4 cm. The structure could represent an appendicolith.  The fluid filled structure measures up to 1.7 cm.  There may be another small calcification or appendicolith in the distal aspect of the tube.  There are areas of low density in the region of the adnexa which are poorly visualized.   In addition, there is a prominent lymph node in the right lower mesentery, measuring 1.4 cm on sequence 2, image 57.  IMPRESSION: Dilated tubular structure in the right lower quadrant is  suspicious for an appendix and appendicitis.  There appears to be at least one calcified appendicolith in the proximal aspect of the appendix.  Cannot exclude edema and fluid around the appendix.  Prominent right ileocecal mesenteric nodes are probably reactive in etiology.  These results were called by telephone on 09/05/2012 at 10:58 p.m. to Dr. Nicanor Alcon, who verbally acknowledged these results.   Original Report Authenticated By: Richarda Overlie, M.D.     Assessment: s/p Procedure(s): APPENDECTOMY LAPAROSCOPIC There is no problem list on file for this patient.  Acute Appendicitis  Plan: Advance diet possible  d/c this evening or tom am depending on Pt progression   LOS: 1 day     .Vanita Panda, MD [facility], [place] [phone]   09/06/2012 8:49 AM

## 2012-09-07 ENCOUNTER — Telehealth (INDEPENDENT_AMBULATORY_CARE_PROVIDER_SITE_OTHER): Payer: Self-pay

## 2012-09-07 ENCOUNTER — Encounter (HOSPITAL_COMMUNITY): Payer: Self-pay | Admitting: Surgery

## 2012-09-07 NOTE — Telephone Encounter (Signed)
I called and left the patient a message with her postop appointment of 10/16 at 12:30 but arrive at 12:15.

## 2012-09-07 NOTE — Telephone Encounter (Signed)
Message copied by Ivory Broad on Mon Sep 07, 2012  4:47 PM ------      Message from: Cathi Roan      Created: Mon Sep 07, 2012  2:36 PM      Regarding: 1st post op      Contact: [phone]       Patient needs 1st post op for lap appy

## 2012-09-11 ENCOUNTER — Encounter (INDEPENDENT_AMBULATORY_CARE_PROVIDER_SITE_OTHER): Payer: Self-pay

## 2012-09-17 ENCOUNTER — Encounter (INDEPENDENT_AMBULATORY_CARE_PROVIDER_SITE_OTHER): Payer: Self-pay

## 2012-09-23 ENCOUNTER — Ambulatory Visit (INDEPENDENT_AMBULATORY_CARE_PROVIDER_SITE_OTHER): Payer: Self-pay | Admitting: Surgery

## 2012-09-23 ENCOUNTER — Encounter (INDEPENDENT_AMBULATORY_CARE_PROVIDER_SITE_OTHER): Payer: Self-pay | Admitting: Surgery

## 2012-09-23 VITALS — BP 118/62 | HR 82 | Temp 98.3°F | Resp 14 | Ht 69.0 in | Wt 222.0 lb

## 2012-09-23 DIAGNOSIS — Z9889 Other specified postprocedural states: Secondary | ICD-10-CM

## 2012-09-23 DIAGNOSIS — Z9049 Acquired absence of other specified parts of digestive tract: Secondary | ICD-10-CM

## 2012-09-23 NOTE — Progress Notes (Signed)
Post Op Note  The patient has done well from a laparoscopic appendectomy performed 09/06/2012.  She is back at work. Her incisions good.  I gave her copy of her path report.  Return appointment is when necessary.  Ovidio Kin, MD, [facility] Surgery Pager: [phone] Office phone:  [phone]

## 2014-07-13 ENCOUNTER — Other Ambulatory Visit: Payer: Self-pay | Admitting: Obstetrics & Gynecology

## 2014-07-13 ENCOUNTER — Other Ambulatory Visit (HOSPITAL_COMMUNITY)
Admission: RE | Admit: 2014-07-13 | Discharge: 2014-07-13 | Disposition: A | Discharge status: Home / Self Care | Payer: 59 | Source: Ambulatory Visit | Attending: Obstetrics & Gynecology | Admitting: Obstetrics & Gynecology

## 2014-07-13 DIAGNOSIS — Z124 Encounter for screening for malignant neoplasm of cervix: Secondary | ICD-10-CM | POA: Diagnosis present

## 2014-07-13 DIAGNOSIS — Z113 Encounter for screening for infections with a predominantly sexual mode of transmission: Secondary | ICD-10-CM | POA: Insufficient documentation

## 2014-07-15 LAB — CYTOLOGY - PAP

## 2014-08-11 ENCOUNTER — Other Ambulatory Visit: Payer: Self-pay | Admitting: Family Medicine

## 2014-08-11 DIAGNOSIS — R202 Paresthesia of skin: Secondary | ICD-10-CM

## 2014-08-19 ENCOUNTER — Ambulatory Visit
Admission: RE | Admit: 2014-08-19 | Discharge: 2014-08-19 | Disposition: A | Discharge status: Home / Self Care | Payer: 59 | Source: Ambulatory Visit | Attending: Family Medicine | Admitting: Family Medicine

## 2014-08-19 DIAGNOSIS — R202 Paresthesia of skin: Secondary | ICD-10-CM

## 2016-07-25 ENCOUNTER — Emergency Department (HOSPITAL_BASED_OUTPATIENT_CLINIC_OR_DEPARTMENT_OTHER)
Admission: EM | Admit: 2016-07-25 | Discharge: 2016-07-25 | Disposition: A | Discharge status: Home / Self Care | Payer: 59 | Attending: Emergency Medicine | Admitting: Emergency Medicine

## 2016-07-25 ENCOUNTER — Encounter (HOSPITAL_BASED_OUTPATIENT_CLINIC_OR_DEPARTMENT_OTHER): Payer: Self-pay | Admitting: *Deleted

## 2016-07-25 DIAGNOSIS — R221 Localized swelling, mass and lump, neck: Secondary | ICD-10-CM | POA: Diagnosis not present

## 2016-07-25 DIAGNOSIS — R55 Syncope and collapse: Secondary | ICD-10-CM | POA: Insufficient documentation

## 2016-07-25 DIAGNOSIS — R509 Fever, unspecified: Secondary | ICD-10-CM | POA: Diagnosis present

## 2016-07-25 DIAGNOSIS — J029 Acute pharyngitis, unspecified: Secondary | ICD-10-CM | POA: Insufficient documentation

## 2016-07-25 LAB — BASIC METABOLIC PANEL
ANION GAP: 8 (ref 5–15)
BUN: 10 mg/dL (ref 6–20)
CALCIUM: 9 mg/dL (ref 8.9–10.3)
CO2: 22 mmol/L (ref 22–32)
Chloride: 105 mmol/L (ref 101–111)
Creatinine, Ser: 0.59 mg/dL (ref 0.44–1.00)
GFR calc non Af Amer: >60 mL/min (ref >60)
Glucose, Bld: 127 mg/dL — ABNORMAL HIGH (ref 65–99)
Potassium: 3.5 mmol/L (ref 3.5–5.1)
Sodium: 135 mmol/L (ref 135–145)

## 2016-07-25 LAB — CBC WITH DIFFERENTIAL/PLATELET
BASOS ABS: 0 10*3/uL (ref 0.0–0.1)
BASOS PCT: 0 %
Eosinophils Absolute: 0 10*3/uL (ref 0.0–0.7)
Eosinophils Relative: 0 %
HEMATOCRIT: 33.8 % — AB (ref 36.0–46.0)
HEMOGLOBIN: 11.1 g/dL — AB (ref 12.0–15.0)
Lymphocytes Relative: 7 %
Lymphs Abs: 1 10*3/uL (ref 0.7–4.0)
MCH: 25.5 pg — ABNORMAL LOW (ref 26.0–34.0)
MCHC: 32.8 g/dL (ref 30.0–36.0)
MCV: 77.5 fL — ABNORMAL LOW (ref 78.0–100.0)
MONOS PCT: 9 %
Monocytes Absolute: 1.3 10*3/uL — ABNORMAL HIGH (ref 0.1–1.0)
NEUTROS ABS: 12 10*3/uL — AB (ref 1.7–7.7)
NEUTROS PCT: 84 %
Platelets: 268 10*3/uL (ref 150–400)
RBC: 4.36 MIL/uL (ref 3.87–5.11)
RDW: 14.8 % (ref 11.5–15.5)
WBC: 14.3 10*3/uL — ABNORMAL HIGH (ref 4.0–10.5)

## 2016-07-25 LAB — HCG, SERUM, QUALITATIVE: Preg, Serum: NEGATIVE

## 2016-07-25 MED ORDER — ACETAMINOPHEN 325 MG PO TABS
650.0000 mg | ORAL_TABLET | Freq: Once | ORAL | Status: AC
  Administered 2016-07-25: 650 mg via ORAL
  Filled 2016-07-25: qty 2

## 2016-07-25 MED ORDER — AMOXICILLIN-POT CLAVULANATE 875-125 MG PO TABS
1.0000 | ORAL_TABLET | Freq: Two times a day (BID) | ORAL | 0 refills | Status: DC
Start: 2016-07-25 — End: 2021-06-20

## 2016-07-25 NOTE — ED Triage Notes (Signed)
Pt reports swollen lymph nodes since April.  States fever that started Sunday.  Went to PCP Tuesday and was started on antibiotics for treatment of strep.  Ibuprofen at 1pm today.

## 2016-07-25 NOTE — ED Provider Notes (Signed)
[facility]-EMERGENCY DEPT Provider Note   CSN: [id number] Arrival date & time: 07/25/16  1520 By signing my name below, I, Levon HedgerElizabeth Hall, attest that this documentation has been prepared under the direction and in the presence of non-physician practitioner, Danelle BerryLeisa Kerianna Rawlinson, PA-C  Electronically Signed: Levon HedgerElizabeth Hall, Scribe. 07/25/2016. 6:43 PM.   History   Chief Complaint Chief Complaint  Patient presents with  . Fever    HPI Kathryn Hardy is a 30 y.o. female who presents to the Emergency Department complaining of a sudden onset, near-syncopal episode which lasted approximately 5-10 minutes which occurred today PTA. She states she was standing in the kitchen making soup at the time of onset. Pt notes associated diaphoresis, nausea, dizziness, arm and hand numbness and tingling, and SOB. She states she felt as if she would lose control of her bowels. Per pt, she has never experienced these symptoms before. Pt states she was seen by her PCP two days ago and is being treated for strep. Pt is currently on day 3 of 10 of amoxicillin which she has taken with relief of strep symptoms. Pt has also taken ibuprofen today with relief. Pt denies hx of blood sugar problems. Pt reports she is on birth control and has regular periods. She denies any chest pain or room spinning. She also denies sneezing, coughing, rhinorrhea, or congestion.   The history is provided by the patient. No language interpreter was used.    Past Medical History:  Diagnosis Date  . Anemia     Patient Active Problem List   Diagnosis Date Noted  . History of appendectomy 09/23/2012    Past Surgical History:  Procedure Laterality Date  . LAPAROSCOPIC APPENDECTOMY  09/06/2012   Procedure: APPENDECTOMY LAPAROSCOPIC;  Surgeon: Kandis Cockingavid H Newman, MD;  Location: [facility] ORS;  Service: General;  Laterality: N/A;    OB History    No data available      Home Medications    Prior to Admission medications   Medication Sig Start Date End  Date Taking? Authorizing Provider  amoxicillin-clavulanate (AUGMENTIN) 875-125 MG tablet Take 1 tablet by mouth every 12 (twelve) hours. 07/25/16   Danelle BerryLeisa Ade Stmarie, PA-C    Family History History reviewed. No pertinent family history.  Social History Social History  Substance Use Topics  . Smoking status: Never Smoker  . Smokeless tobacco: Not on file  . Alcohol use 0.6 oz/week    1 Glasses of wine per week     Allergies   Review of patient's allergies indicates no known allergies.   Review of Systems Review of Systems  All other systems reviewed and are negative.   Physical Exam Updated Vital Signs BP 131/70 (BP Location: Right Arm)   Pulse 99   Temp 98.8 F (37.1 C) (Oral)   Resp 18   Ht 5\' 9"  (1.753 m)   Wt 103.4 kg   LMP 07/18/2016   SpO2 100%   BMI 33.67 kg/m   Physical Exam  Constitutional: She is oriented to person, place, and time. She appears well-developed and well-nourished. No distress.  HENT:  Head: Normocephalic and atraumatic.  Right Ear: External ear normal.  Left Ear: External ear normal.  Nose: Nose normal.  Mouth/Throat: Uvula is midline and mucous membranes are normal. No trismus in the jaw. No uvula swelling. Posterior oropharyngeal erythema present. No oropharyngeal exudate or tonsillar abscesses.  Eyes: Conjunctivae and EOM are normal. Pupils are equal, round, and reactive to light. Right eye exhibits no discharge. Left eye exhibits no  discharge.  Neck: Normal range of motion and phonation normal. Neck supple. No spinous process tenderness and no muscular tenderness present. No tracheal deviation, no erythema and normal range of motion present. No thyromegaly present.    Cardiovascular: Normal rate, regular rhythm, normal heart sounds and intact distal pulses.  Exam reveals no friction rub.   No murmur heard. Pulmonary/Chest: Effort normal and breath sounds normal. No stridor. No respiratory distress. She has no wheezes. She has no rales.    Abdominal: Soft. Bowel sounds are normal. She exhibits no distension. There is no tenderness.  Musculoskeletal: Normal range of motion.  Lymphadenopathy:    She has cervical adenopathy.  Neurological: She is alert and oriented to person, place, and time. She exhibits normal muscle tone. Coordination normal.  Speech is clear and goal oriented, follows commands Major Cranial nerves without deficit, no facial droop Normal strength in upper and lower extremities bilaterally including dorsiflexion and plantar flexion, strong and equal grip strength Sensation normal to light touch Moves extremities without ataxia, coordination intact Normal gait and balance   Skin: Skin is warm and dry. Capillary refill takes less than 2 seconds. No rash noted. She is not diaphoretic. No erythema. No pallor.  Psychiatric: She has a normal mood and affect. Her behavior is normal. Judgment and thought content normal.  Nursing note and vitals reviewed.   ED Treatments / Results  DIAGNOSTIC STUDIES:  Oxygen Saturation is 100% on RA, normal by my interpretation.    COORDINATION OF CARE:  6:10 PM Will order hCG and BMP. Discussed treatment plan with pt at bedside and pt agreed to plan.  Labs (all labs ordered are listed, but only abnormal results are displayed) Labs Reviewed  BASIC METABOLIC PANEL - Abnormal; Notable for the following:       Result Value   Glucose, Bld 127 (*)    All other components within normal limits  CBC WITH DIFFERENTIAL/PLATELET - Abnormal; Notable for the following:    WBC 14.3 (*)    Hemoglobin 11.1 (*)    HCT 33.8 (*)    MCV 77.5 (*)    MCH 25.5 (*)    Neutro Abs 12.0 (*)    Monocytes Absolute 1.3 (*)    All other components within normal limits  HCG, SERUM, QUALITATIVE    EKG  EKG Interpretation  Date/Time:  Thursday July 25 2016 18:27:55 EDT Ventricular Rate:  94 PR Interval:    QRS Duration: 77 QT Interval:  359 QTC Calculation: 449 R Axis:   76 Text  Interpretation:  Sinus rhythm Borderline T abnormalities, anterior leads No previous tracing Confirmed by ZACKOWSKI  MD, SCOTT ([id number]) on 07/25/2016 7:09:38 PM       Radiology No results found.  Procedures Procedures (including critical care time)  Medications Ordered in ED Medications  acetaminophen (TYLENOL) tablet 650 mg (650 mg Oral Given 07/25/16 1532)    Initial Impression / Assessment and Plan / ED Course  I have reviewed the triage vital signs and the nursing notes.  Pertinent labs & imaging results that were available during my care of the patient were reviewed by me and considered in my medical decision making (see chart for details).  Clinical Course    Patient presents with chief complaint of near syncope that occurred this afternoon. She felt like she is going to pass out after standing in her kitchen preparing soup for herself. She had some associated upper extremity tingling, shortness of breath and diaphoresis. She denies any other associated  symptoms. She did not lose consciousness. She sat down for the fan on her and was able to eat and after 5-10 minutes felt much better. After discussing it with her family member she came in for evaluation.  She is currently asymptomatic. She states that recently she has been treated for 3 days with amoxicillin for clinical diagnosis of strep throat associated with lymphadenopathy and one large swollen area in the left side of her neck.  She states she is continuing to have fevers, felt malaise and slightly decreased appetite. Today she had not eaten or drinking much.    Basic lab work and EKG was obtained Labwork significant for leukocytosis.  Patient was not orthostatic, and she's had stable vital signs while in the ER. She is able to eat and drink without difficulty. Currently does not have any pain and has no symptoms.  Large swollen area in her neck is asymmetrical, of concern for infection, is being followed by her PCP she states  she has had more than 3 days of antibiotics with amoxicillin, we will offer Augmentin, encouraged continued follow-up and management by PCP. Return precautions reviewed pt verbalized understanding.   Final Clinical Impressions(s) / ED Diagnoses   Final diagnoses:  Near syncope  Neck mass  Pharyngitis    I personally performed the services described in this documentation, which was scribed in my presence. The recorded information has been reviewed and is accurate.     New Prescriptions Discharge Medication List as of 07/25/2016  7:11 PM    START taking these medications   Details  amoxicillin-clavulanate (AUGMENTIN) 875-125 MG tablet Take 1 tablet by mouth every 12 (twelve) hours., Starting Thu 07/25/2016, Print         Danelle Berry, PA-C 07/28/16 2317    Vanetta Mulders, MD 08/01/16 [phone]

## 2016-07-31 ENCOUNTER — Other Ambulatory Visit: Payer: Self-pay | Admitting: Family Medicine

## 2016-07-31 ENCOUNTER — Ambulatory Visit
Admission: RE | Admit: 2016-07-31 | Discharge: 2016-07-31 | Disposition: A | Discharge status: Home / Self Care | Payer: 59 | Source: Ambulatory Visit | Attending: Family Medicine | Admitting: Family Medicine

## 2016-07-31 DIAGNOSIS — R221 Localized swelling, mass and lump, neck: Secondary | ICD-10-CM

## 2016-07-31 MED ORDER — IOPAMIDOL (ISOVUE-300) INJECTION 61%
75.0000 mL | Freq: Once | INTRAVENOUS | Status: AC | PRN
  Administered 2016-07-31: 75 mL via INTRAVENOUS

## 2017-04-16 ENCOUNTER — Other Ambulatory Visit: Payer: Self-pay | Admitting: Obstetrics & Gynecology

## 2017-04-16 ENCOUNTER — Other Ambulatory Visit (HOSPITAL_COMMUNITY)
Admission: RE | Admit: 2017-04-16 | Discharge: 2017-04-16 | Disposition: A | Discharge status: Home / Self Care | Payer: 59 | Source: Ambulatory Visit | Attending: Obstetrics & Gynecology | Admitting: Obstetrics & Gynecology

## 2017-04-16 DIAGNOSIS — Z1151 Encounter for screening for human papillomavirus (HPV): Secondary | ICD-10-CM | POA: Diagnosis present

## 2017-04-16 DIAGNOSIS — Z01411 Encounter for gynecological examination (general) (routine) with abnormal findings: Secondary | ICD-10-CM | POA: Diagnosis present

## 2017-04-22 LAB — CYTOLOGY - PAP
Diagnosis: NEGATIVE
HPV: NOT DETECTED

## 2019-08-09 ENCOUNTER — Other Ambulatory Visit: Payer: Self-pay | Admitting: Family Medicine

## 2019-08-09 DIAGNOSIS — R600 Localized edema: Secondary | ICD-10-CM

## 2019-08-11 ENCOUNTER — Ambulatory Visit
Admission: RE | Admit: 2019-08-11 | Discharge: 2019-08-11 | Disposition: A | Discharge status: Home / Self Care | Payer: 59 | Source: Ambulatory Visit | Attending: Family Medicine | Admitting: Family Medicine

## 2019-08-11 DIAGNOSIS — R600 Localized edema: Secondary | ICD-10-CM

## 2019-12-16 ENCOUNTER — Ambulatory Visit: Payer: Managed Care, Other (non HMO) | Attending: Internal Medicine

## 2019-12-16 DIAGNOSIS — Z20822 Contact with and (suspected) exposure to covid-19: Secondary | ICD-10-CM

## 2019-12-18 LAB — NOVEL CORONAVIRUS, NAA: SARS-CoV-2, NAA: NOT DETECTED

## 2020-07-09 DIAGNOSIS — R7303 Prediabetes: Secondary | ICD-10-CM

## 2020-07-09 HISTORY — DX: Prediabetes: R73.03

## 2020-12-16 IMAGING — US US EXTREM LOW VENOUS
1 series · 13 of 24 positions shown · non-contrast
Comparison: None.

CLINICAL DATA: Bilateral lower extremity edema.  Evaluate for DVT.



[Series 1: us extrem low venous · 0.08mm/px · 13 of 56 slices shown]
[im 1/56]
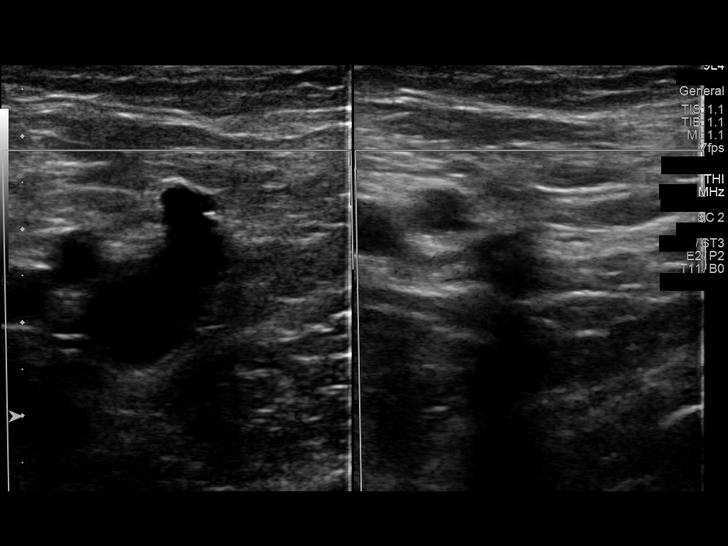
[im 5/56]
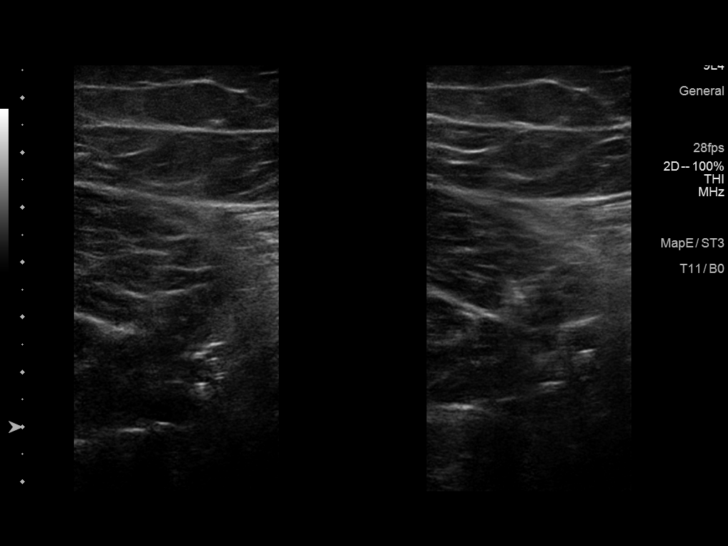
[im 10/56]
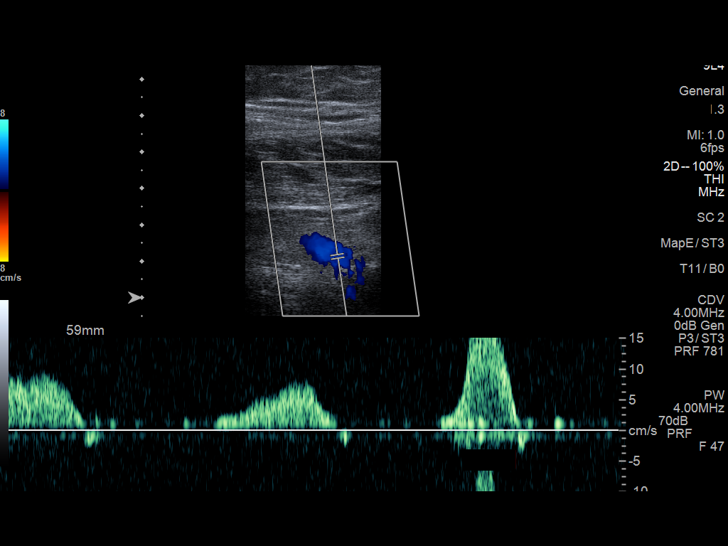
[im 15/56]
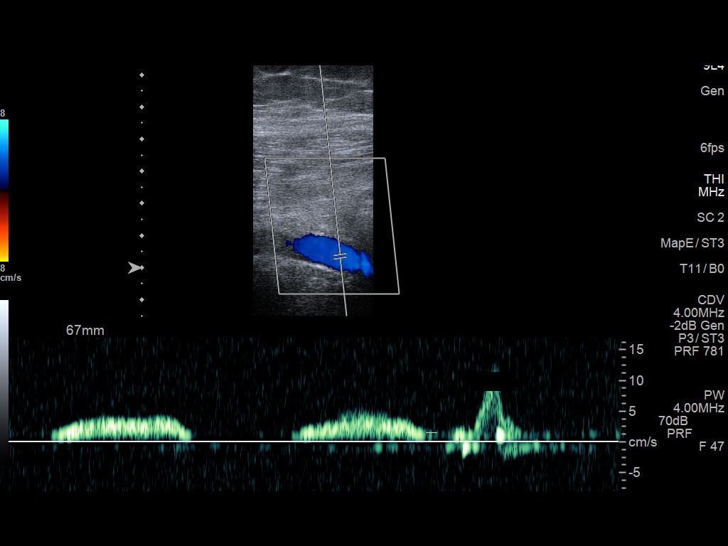
[im 20/56]
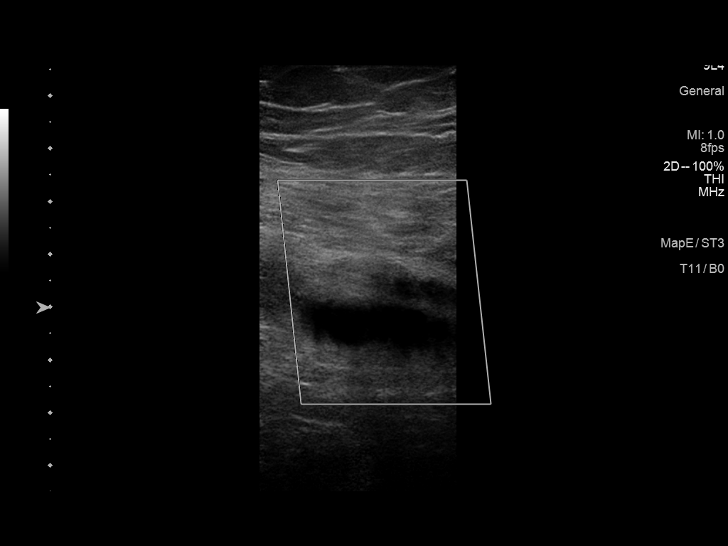
[im 24/56]
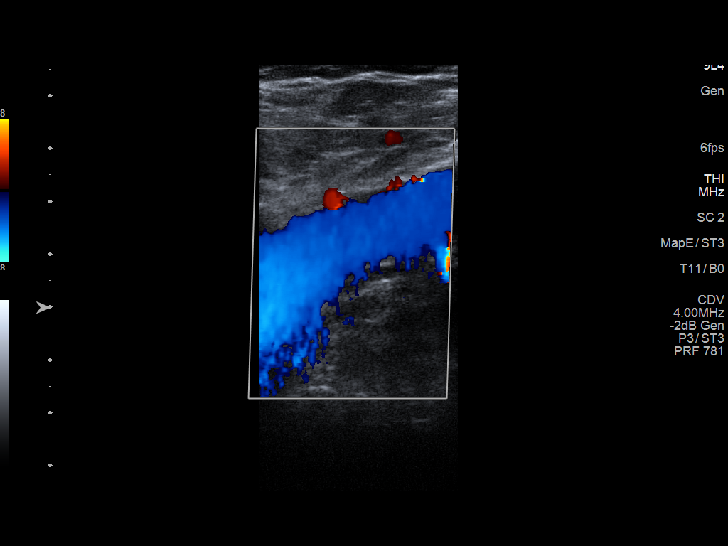
[im 29/56]
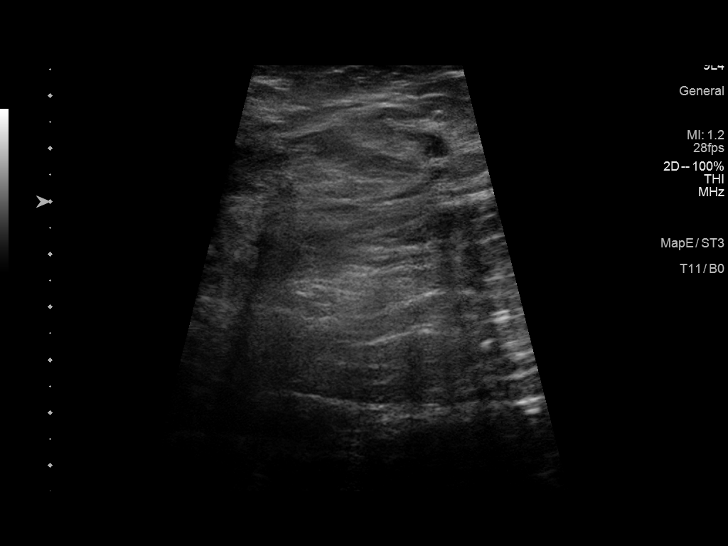
[im 32/56]
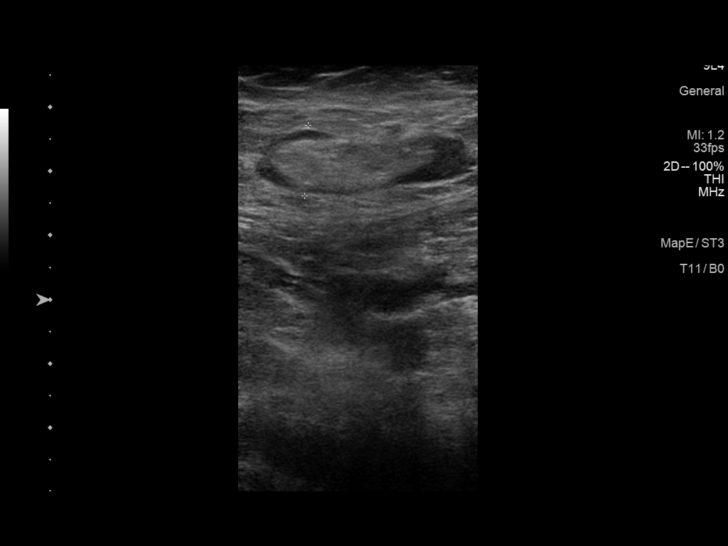
[im 36/56]
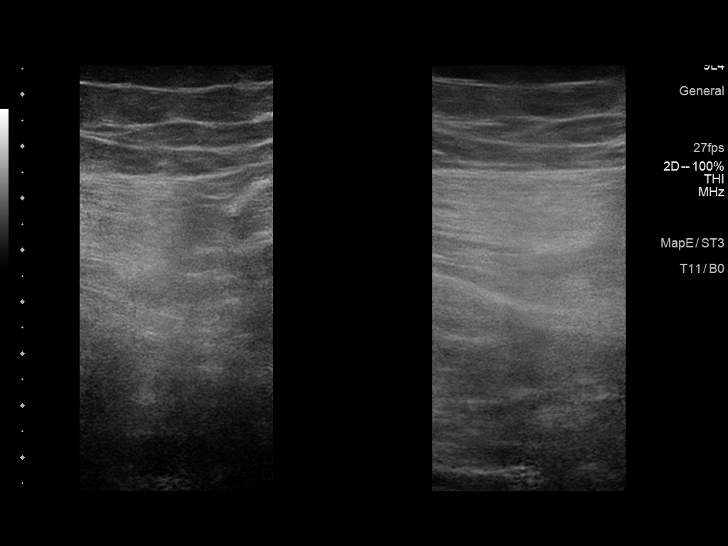
[im 41/56]
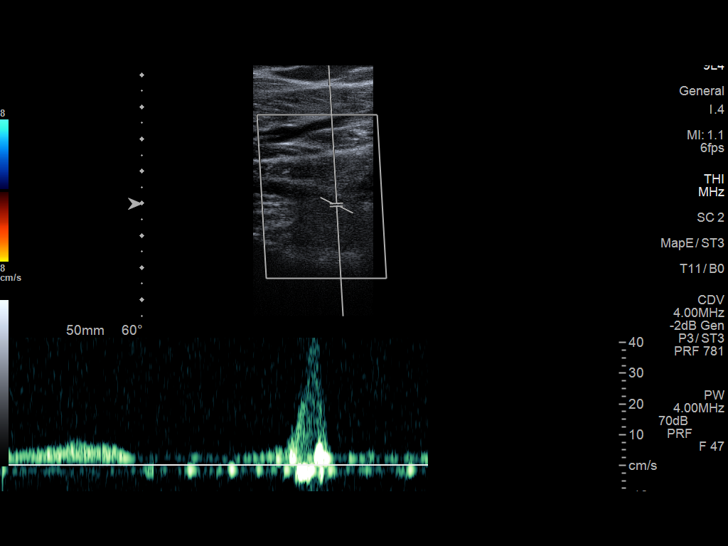
[im 46/56]
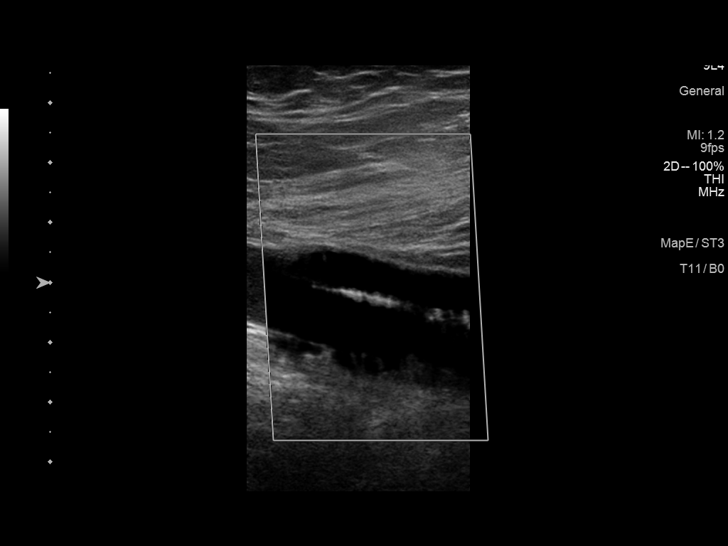
[im 51/56]
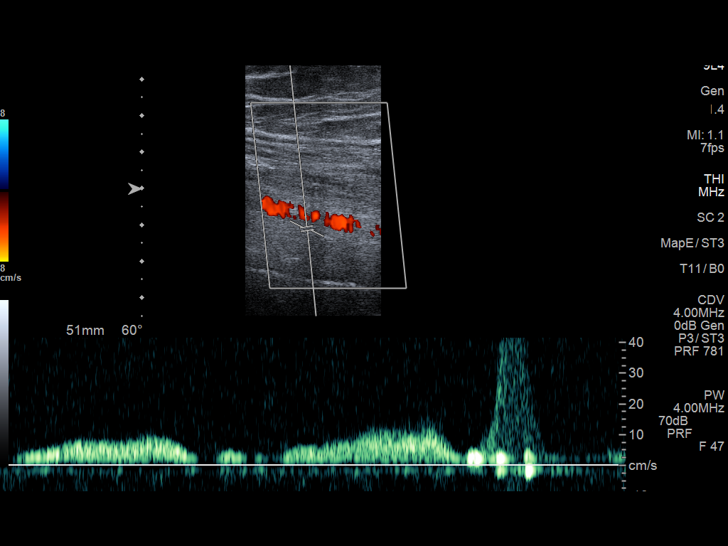
[im 56/56]
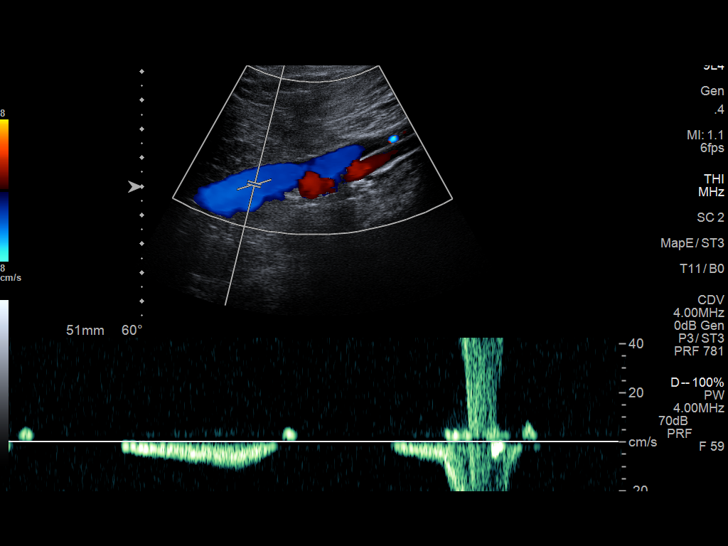

[13 of 24 positions shown; findings below may reference images not displayed]

FINDINGS: RIGHT LOWER EXTREMITY

Common Femoral Vein: No evidence of thrombus. Normal
compressibility, respiratory phasicity and response to augmentation.

Saphenofemoral Junction: No evidence of thrombus. Normal
compressibility and flow on color Doppler imaging.

Profunda Femoral Vein: No evidence of thrombus. Normal
compressibility and flow on color Doppler imaging.

Femoral Vein: No evidence of thrombus. Normal compressibility,
respiratory phasicity and response to augmentation.

Popliteal Vein: No evidence of thrombus. Normal compressibility,
respiratory phasicity and response to augmentation.

Calf Veins: No evidence of thrombus. Normal compressibility and flow
on color Doppler imaging.

Superficial Great Saphenous Vein: No evidence of thrombus. Normal
compressibility.

Venous Reflux:  None.

Other Findings: Note is made of a benign appearing right inguinal
lymph node which is measures 1.3 cm in greatest short axis diameter
and maintains a benign fatty hilum (image 29), presumably reactive
in etiology.

LEFT LOWER EXTREMITY

Common Femoral Vein: No evidence of thrombus. Normal
compressibility, respiratory phasicity and response to augmentation.

Saphenofemoral Junction: No evidence of thrombus. Normal
compressibility and flow on color Doppler imaging.

Profunda Femoral Vein: No evidence of thrombus. Normal
compressibility and flow on color Doppler imaging.

Femoral Vein: No evidence of thrombus. Normal compressibility,
respiratory phasicity and response to augmentation.

Popliteal Vein: No evidence of thrombus. Normal compressibility,
respiratory phasicity and response to augmentation.

Calf Veins: No evidence of thrombus. Normal compressibility and flow
on color Doppler imaging.

Superficial Great Saphenous Vein: No evidence of thrombus. Normal
compressibility.

Venous Reflux:  None.

Other Findings: Note is made of a benign appearing left inguinal
lymph node which measures approximately 1.1 cm in greatest short
axis diameter and maintains a benign fatty hilum (image 33),
presumably reactive in etiology.
IMPRESSION: No evidence of DVT within either lower extremity.

## 2021-04-18 ENCOUNTER — Encounter (HOSPITAL_BASED_OUTPATIENT_CLINIC_OR_DEPARTMENT_OTHER): Payer: Self-pay | Admitting: Obstetrics and Gynecology

## 2021-04-20 ENCOUNTER — Other Ambulatory Visit: Payer: Self-pay | Admitting: Obstetrics and Gynecology

## 2021-04-23 ENCOUNTER — Other Ambulatory Visit (HOSPITAL_COMMUNITY)
Admission: RE | Admit: 2021-04-23 | Discharge: 2021-04-23 | Disposition: A | Discharge status: Home / Self Care | Payer: Managed Care, Other (non HMO) | Source: Ambulatory Visit | Attending: Obstetrics and Gynecology | Admitting: Obstetrics and Gynecology

## 2021-04-23 DIAGNOSIS — Z01812 Encounter for preprocedural laboratory examination: Secondary | ICD-10-CM | POA: Diagnosis present

## 2021-04-23 DIAGNOSIS — U071 COVID-19: Secondary | ICD-10-CM | POA: Diagnosis not present

## 2021-04-24 LAB — SARS CORONAVIRUS 2 (TAT 6-24 HRS): SARS Coronavirus 2: POSITIVE — AB

## 2021-04-24 NOTE — Progress Notes (Signed)
Notified Dr. Dawayne Patricia office of patient's positive covid test. Informed that patient could be rescheduled after day 10 at the surgery center, or scheduled at the main OR if urgent. Informed the case would be dropped to the depot for disposition.

## 2021-06-20 ENCOUNTER — Other Ambulatory Visit: Payer: Self-pay

## 2021-06-20 ENCOUNTER — Encounter (HOSPITAL_BASED_OUTPATIENT_CLINIC_OR_DEPARTMENT_OTHER): Payer: Self-pay | Admitting: Obstetrics and Gynecology

## 2021-06-20 DIAGNOSIS — R42 Dizziness and giddiness: Secondary | ICD-10-CM

## 2021-06-20 HISTORY — DX: Dizziness and giddiness: R42

## 2021-06-20 NOTE — Progress Notes (Signed)
Spoke w/ via phone for pre-op interview---PT Lab needs dos---CBC,  TYPE /SCREEN-   URINE PREG            Lab results------NONE COVID test -----patient states asymptomatic no test needed Arrive at -------1115 AM NPO after MN NO Solid Food.  Clear liquids from MN until---1015 Med rec completed Medications to take morning of surgery -----NONE Diabetic medication -----NONE Patient instructed no nail polish to be worn day of surgery Patient instructed to bring photo id and insurance card day of surgery Patient aware to have Driver (ride ) / caregiver  Kathryn Hardy (SISTER)  for 24 hours after surgery  Patient Special Instructions -----NONE Pre-Op special Istructions -----NONE Patient verbalized understanding of instructions that were given at this phone interview. Patient denies shortness of breath, chest pain, fever, cough at this phone interview.

## 2021-06-25 ENCOUNTER — Other Ambulatory Visit: Payer: Self-pay | Admitting: Obstetrics and Gynecology

## 2021-06-25 NOTE — H&P (Deleted)
  The note originally documented on this encounter has been moved the the encounter in which it belongs.  

## 2021-06-25 NOTE — H&P (Signed)
Reason for Appointment  1. Preop/ Complex left ovarian cyst.    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 Does patient report new onset of COVID symptoms? No. Has patient or close contact tested positive for COVID-19? No , not in the past 2 weeks.  General:  35 yo pt presents for pre-op for Laparoscopic L ovarian cystectomy with possible oophorectomy. Surgery is scheduled for 04/25/2021.  On 01/05/2021, pt called that she would like to schedule an appointment to discuss her lab results. Her lab results showed that her CA 125 was elevated and this could be due to endometriosis. In 11/2020, pt had a complex ovarian cyst that was thought to be due to endometriosis found on [place].  Pt had repeat US done on 01/04/2021.  US showed uterus measures 9.1cm x 5.9cm x 6.2cm, endometrium 3.41mm; L ovary had 2 complex cysts measuring 6.0cm and 4cm; both increased size from previous US on 11/09/2020. [place] results discussed with pt.    Current Medications  Taking   Ibuprofen 200 MG Tablet 1 tablet with food or milk as needed Orally Three times a day, Notes: prn  Not-Taking   Junel 1/20(Norethindrone Acet-Ethinyl Est) 1-20 MG-MCG Tablet 1 tablet Orally Once a day  Medication List reviewed and reconciled with the patient   Past Medical History  anemia in HS.   Chronic leg edema.   Prediabetes.    Surgical History  Appendix removed    Family History  Father: deceased 91 yrs, Heart disease, stroke and heart attacks?  Mother: deceased 60 yrs, pneumonia, sarcodosis, diagnosed with Diabetes, Hypertension, Breast cancer  Sister 1: alive 61 yrs, A + W  Maternal aunt: alive, RA, diagnosed with Hypertension   Social History  General:  Tobacco use cigarettes: Never smoked, Tobacco history last updated 04/11/2021, Vaping No. Alcohol: yes, wine/liquor 4 a month. Caffeine: yes, Coffee/Soda, occasionally, 2 servings daily. no Recreational drug use. no  Exercise. Marital Status: single. Children: 1 daughter,. OCCUPATION: employed, [employer].    Gyn History  Sexual activity not currently sexually active.  Periods : regular.  LMP 04/01/2021.  Birth control Mirena IUD.  Last pap smear date 11/08/2020 - neg.  Denies H/O Last mammogram date N/A.  Denies H/O Abnormal pap smear none .    OB History  Number of pregnancies 1.  Pregnancy # 1 live birth, vaginal delivery, girl.    Allergies  N.K.D.A.   Hospitalization/Major Diagnostic Procedure  Childbirth    Review of Systems  CONSTITUTIONAL:  Chills No. Fatigue No. Fever No. Night sweats No. Recent travel outside [place] No. Sweats No. Weight change No.  OPHTHALMOLOGY:  Blurring of vision no. Change in vision no. Double vision no.  ENT:  Dizziness no. Nose bleeds no. Sore throat no. Teeth pain no.  ALLERGY:  Hives no.  CARDIOLOGY:  Chest pain no. High blood pressure no. Irregular heart beat no. Leg edema no. Palpitations no.  RESPIRATORY:  Shortness of breath no. Cough no. Wheezing no.  UROLOGY:  Pain with urination no. Urinary urgency no. Urinary frequency no. Urinary incontinence no. Difficulty urinating No. Blood in urine No.  GASTROENTEROLOGY:  Abdominal pain no. Appetite change no. Bloating/belching no. Blood in stool or on toilet paper no. Change in bowel movements no. Constipation no. Diarrhea no. Difficulty swallowing no. Nausea no.  FEMALE REPRODUCTIVE:  Vulvar pain no. Vulvar rash no. Abnormal vaginal bleeding no. Breast pain no. Nipple discharge no. Pain with intercourse no. Pelvic pain no. Unusual  vaginal discharge no. Vaginal itching no.  MUSCULOSKELETAL:  Muscle aches no.  NEUROLOGY:  Headache no. Tingling/numbness no. Weakness no.  PSYCHOLOGY:  Depression no. Anxiety no. Nervousness no. Sleep disturbances no. Suicidal ideation no .  ENDOCRINOLOGY:  Excessive thirst no. Excessive urination no. Hair loss no. Heat or cold intolerance no.  HEMATOLOGY/LYMPH:  Abnormal  bleeding no. Easy bruising no. Swollen glands no.  DERMATOLOGY:  New/changing skin lesion no. Rash no. Sores no.  Negative except as stated in HPI.   Vital Signs  Wt 246.6, Wt change -9.8 lb, Ht 69, BMI 36.41, Pulse sitting 87, BP sitting 108/72.   Examination  General Examination: CONSTITUTIONAL: alert, oriented, NAD . SKIN: moist, warm. EYES: Conjunctiva clear. LUNGS: good I:E efffort noted, clear to auscultation bilaterally. HEART: regular rate and rhythm. ABDOMEN: soft, non-tender/non-distended, bowel sounds present . FEMALE GENITOURINARY: normal external genitalia, labia - unremarkable, vagina - Small amount of discharge in vaginal vault. , cervix - no discharge or lesions or CMT, adnexa - Left adnexal fullness. , uterus - nontender and normal size on palpation . EXTREMITIES: no edema present. PSYCH: affect normal, good eye contact.     Physical Examination  Pt aware of scribe services today.   Assessments     1. Ovarian cyst, complex - N83.299 (Primary)   2. Vaginal discharge - N89.8   Treatment  1. Ovarian cyst, complex  Notes: given the complex nature , increased size of cyst surgical excision is recommended. planning for laporoscopic left ovarian cystectomy possible left oophorectomy . R/B/A of surgery discussed with patient including but not limted to infection bleeding damage to bowel and ureters with the need for further surgery. pt voiced understanding and desires to proceed.    2. Vaginal discharge  LAB: [facility]   Procedures  Scribe Documentation:  Attestation: I personally scribed for Dr. Richardson Dopp on the date of this appointment. Electronically signed by scribe , Grier Rocher 04/11/2021 04:06:29 PM > .        Labs    Lab: [facility] Normal   Value Reference Range  WBCS Normal  Normal -   CLUE CELLS None seen  None seen -   TRICHOMONAS None Seen  None Seen -   YEAST None seen  None seen -

## 2021-06-26 ENCOUNTER — Other Ambulatory Visit: Payer: Self-pay

## 2021-06-26 ENCOUNTER — Ambulatory Visit (HOSPITAL_BASED_OUTPATIENT_CLINIC_OR_DEPARTMENT_OTHER)
Admission: RE | Admit: 2021-06-26 | Discharge: 2021-06-26 | Disposition: A | Discharge status: Home / Self Care | Payer: Managed Care, Other (non HMO) | Attending: Obstetrics and Gynecology | Admitting: Obstetrics and Gynecology

## 2021-06-26 ENCOUNTER — Ambulatory Visit (HOSPITAL_BASED_OUTPATIENT_CLINIC_OR_DEPARTMENT_OTHER): Payer: Managed Care, Other (non HMO) | Admitting: Anesthesiology

## 2021-06-26 ENCOUNTER — Encounter (HOSPITAL_BASED_OUTPATIENT_CLINIC_OR_DEPARTMENT_OTHER): Payer: Self-pay | Admitting: Obstetrics and Gynecology

## 2021-06-26 ENCOUNTER — Encounter (HOSPITAL_BASED_OUTPATIENT_CLINIC_OR_DEPARTMENT_OTHER)
Admission: RE | Disposition: A | Discharge status: Home / Self Care | Payer: Self-pay | Source: Home / Self Care | Attending: Obstetrics and Gynecology

## 2021-06-26 DIAGNOSIS — Z803 Family history of malignant neoplasm of breast: Secondary | ICD-10-CM | POA: Insufficient documentation

## 2021-06-26 DIAGNOSIS — R7303 Prediabetes: Secondary | ICD-10-CM | POA: Insufficient documentation

## 2021-06-26 DIAGNOSIS — R600 Localized edema: Secondary | ICD-10-CM | POA: Diagnosis not present

## 2021-06-26 DIAGNOSIS — N83202 Unspecified ovarian cyst, left side: Secondary | ICD-10-CM | POA: Insufficient documentation

## 2021-06-26 DIAGNOSIS — Z833 Family history of diabetes mellitus: Secondary | ICD-10-CM | POA: Insufficient documentation

## 2021-06-26 DIAGNOSIS — N858 Other specified noninflammatory disorders of uterus: Secondary | ICD-10-CM | POA: Diagnosis not present

## 2021-06-26 DIAGNOSIS — N9489 Other specified conditions associated with female genital organs and menstrual cycle: Secondary | ICD-10-CM | POA: Diagnosis present

## 2021-06-26 DIAGNOSIS — Z975 Presence of (intrauterine) contraceptive device: Secondary | ICD-10-CM | POA: Insufficient documentation

## 2021-06-26 HISTORY — PX: LAPAROSCOPIC OVARIAN CYSTECTOMY: SHX6248

## 2021-06-26 HISTORY — DX: Cardiac murmur, unspecified: R01.1

## 2021-06-26 HISTORY — DX: Unspecified osteoarthritis, unspecified site: M19.90

## 2021-06-26 HISTORY — PX: OOPHORECTOMY: SHX6387

## 2021-06-26 LAB — ABO/RH: ABO/RH(D): O POS

## 2021-06-26 LAB — CBC
HCT: 42.7 % (ref 36.0–46.0)
Hemoglobin: 13.4 g/dL (ref 12.0–15.0)
MCH: 26.2 pg (ref 26.0–34.0)
MCHC: 31.4 g/dL (ref 30.0–36.0)
MCV: 83.4 fL (ref 80.0–100.0)
Platelets: 268 10*3/uL (ref 150–400)
RBC: 5.12 MIL/uL — ABNORMAL HIGH (ref 3.87–5.11)
RDW: 13.4 % (ref 11.5–15.5)
WBC: 6.8 10*3/uL (ref 4.0–10.5)
nRBC: 0 % (ref 0.0–0.2)

## 2021-06-26 LAB — TYPE AND SCREEN
ABO/RH(D): O POS
Antibody Screen: NEGATIVE

## 2021-06-26 LAB — POCT PREGNANCY, URINE: Preg Test, Ur: NEGATIVE

## 2021-06-26 SURGERY — EXCISION, CYST, OVARY, LAPAROSCOPIC
Anesthesia: General | Site: Abdomen | Laterality: Left

## 2021-06-26 MED ORDER — CELECOXIB 200 MG PO CAPS
400.0000 mg | ORAL_CAPSULE | ORAL | Status: AC
  Administered 2021-06-26: 400 mg via ORAL

## 2021-06-26 MED ORDER — SCOPOLAMINE 1 MG/3DAYS TD PT72
MEDICATED_PATCH | TRANSDERMAL | Status: AC
  Filled 2021-06-26: qty 1

## 2021-06-26 MED ORDER — LIDOCAINE 2% (20 MG/ML) 5 ML SYRINGE
INTRAMUSCULAR | Status: DC | PRN
  Administered 2021-06-26: 80 mg via INTRAVENOUS

## 2021-06-26 MED ORDER — PROMETHAZINE HCL 25 MG/ML IJ SOLN: 6.2500 mg | INTRAMUSCULAR | Status: DC | PRN

## 2021-06-26 MED ORDER — POVIDONE-IODINE 10 % EX SWAB: 2.0000 "application " | Freq: Once | CUTANEOUS | Status: DC

## 2021-06-26 MED ORDER — MIDAZOLAM HCL 2 MG/2ML IJ SOLN
INTRAMUSCULAR | Status: AC
  Filled 2021-06-26: qty 2

## 2021-06-26 MED ORDER — ROCURONIUM BROMIDE 10 MG/ML (PF) SYRINGE
PREFILLED_SYRINGE | INTRAVENOUS | Status: DC | PRN
  Administered 2021-06-26: 10 mg via INTRAVENOUS
  Administered 2021-06-26: 60 mg via INTRAVENOUS

## 2021-06-26 MED ORDER — GLYCOPYRROLATE 0.2 MG/ML IJ SOLN
INTRAMUSCULAR | Status: DC | PRN
  Administered 2021-06-26: .1 mg via INTRAVENOUS

## 2021-06-26 MED ORDER — FENTANYL CITRATE (PF) 100 MCG/2ML IJ SOLN
INTRAMUSCULAR | Status: AC
  Filled 2021-06-26: qty 2

## 2021-06-26 MED ORDER — SCOPOLAMINE 1 MG/3DAYS TD PT72
1.0000 | MEDICATED_PATCH | Freq: Once | TRANSDERMAL | Status: DC
  Administered 2021-06-26: 1.5 mg via TRANSDERMAL

## 2021-06-26 MED ORDER — LACTATED RINGERS IV SOLN: INTRAVENOUS | Status: DC

## 2021-06-26 MED ORDER — ACETAMINOPHEN 500 MG PO TABS
1000.0000 mg | ORAL_TABLET | ORAL | Status: AC
  Administered 2021-06-26: 1000 mg via ORAL

## 2021-06-26 MED ORDER — OXYCODONE HCL 5 MG PO TABS: 5.0000 mg | ORAL_TABLET | Freq: Once | ORAL | Status: DC

## 2021-06-26 MED ORDER — SODIUM CHLORIDE 0.9 % IV SOLN
2.0000 g | INTRAVENOUS | Status: AC
  Administered 2021-06-26: 2 g via INTRAVENOUS

## 2021-06-26 MED ORDER — OXYCODONE HCL 5 MG PO TABS: 5.0000 mg | ORAL_TABLET | Freq: Four times a day (QID) | ORAL | 0 refills | Status: AC | PRN

## 2021-06-26 MED ORDER — LIDOCAINE HCL (PF) 2 % IJ SOLN
INTRAMUSCULAR | Status: DC | PRN
Start: 2021-06-26 — End: 2021-06-26
  Administered 2021-06-26: 1.5 mg/kg/h via INTRADERMAL

## 2021-06-26 MED ORDER — SODIUM CHLORIDE 0.9 % IR SOLN
Status: DC | PRN
  Administered 2021-06-26: 3000 mL

## 2021-06-26 MED ORDER — MIDAZOLAM HCL 2 MG/2ML IJ SOLN
INTRAMUSCULAR | Status: DC | PRN
  Administered 2021-06-26: 2 mg via INTRAVENOUS

## 2021-06-26 MED ORDER — CELECOXIB 200 MG PO CAPS
ORAL_CAPSULE | ORAL | Status: AC
  Filled 2021-06-26: qty 1

## 2021-06-26 MED ORDER — FENTANYL CITRATE (PF) 100 MCG/2ML IJ SOLN
25.0000 ug | INTRAMUSCULAR | Status: DC | PRN
  Administered 2021-06-26 (×2): 50 ug via INTRAVENOUS

## 2021-06-26 MED ORDER — PROCHLORPERAZINE MALEATE 10 MG PO TABS
10.0000 mg | ORAL_TABLET | Freq: Once | ORAL | Status: AC
  Administered 2021-06-26: 10 mg via ORAL
  Filled 2021-06-26: qty 1

## 2021-06-26 MED ORDER — BUPIVACAINE HCL (PF) 0.25 % IJ SOLN
INTRAMUSCULAR | Status: DC | PRN
  Administered 2021-06-26: 10 mL
  Administered 2021-06-26: 30 mL

## 2021-06-26 MED ORDER — SODIUM CHLORIDE 0.9 % IV SOLN
INTRAVENOUS | Status: AC
  Filled 2021-06-26: qty 2

## 2021-06-26 MED ORDER — KETOROLAC TROMETHAMINE 30 MG/ML IJ SOLN
INTRAMUSCULAR | Status: AC
  Filled 2021-06-26: qty 1

## 2021-06-26 MED ORDER — ACETAMINOPHEN 500 MG PO TABS
ORAL_TABLET | ORAL | Status: AC
  Filled 2021-06-26: qty 2

## 2021-06-26 MED ORDER — PROPOFOL 10 MG/ML IV BOLUS
INTRAVENOUS | Status: DC | PRN
  Administered 2021-06-26: 50 mg via INTRAVENOUS
  Administered 2021-06-26: 40 mg via INTRAVENOUS
  Administered 2021-06-26: 150 mg via INTRAVENOUS

## 2021-06-26 MED ORDER — IBUPROFEN 800 MG PO TABS: ORAL_TABLET | ORAL | 1 refills | Status: AC

## 2021-06-26 MED ORDER — ONDANSETRON HCL 4 MG/2ML IJ SOLN
INTRAMUSCULAR | Status: AC
  Filled 2021-06-26: qty 2

## 2021-06-26 MED ORDER — OXYCODONE HCL 5 MG PO TABS
ORAL_TABLET | ORAL | Status: AC
  Filled 2021-06-26: qty 1

## 2021-06-26 MED ORDER — FENTANYL CITRATE (PF) 100 MCG/2ML IJ SOLN
INTRAMUSCULAR | Status: DC | PRN
  Administered 2021-06-26: 50 ug via INTRAVENOUS
  Administered 2021-06-26: 100 ug via INTRAVENOUS
  Administered 2021-06-26: 25 ug via INTRAVENOUS
  Administered 2021-06-26: 50 ug via INTRAVENOUS
  Administered 2021-06-26: 25 ug via INTRAVENOUS
  Administered 2021-06-26: 50 ug via INTRAVENOUS
  Administered 2021-06-26 (×2): 25 ug via INTRAVENOUS

## 2021-06-26 MED ORDER — DEXAMETHASONE SODIUM PHOSPHATE 10 MG/ML IJ SOLN
INTRAMUSCULAR | Status: DC | PRN
  Administered 2021-06-26: 8 mg via INTRAVENOUS

## 2021-06-26 MED ORDER — ROCURONIUM BROMIDE 10 MG/ML (PF) SYRINGE
PREFILLED_SYRINGE | INTRAVENOUS | Status: AC
  Filled 2021-06-26: qty 10

## 2021-06-26 MED ORDER — ONDANSETRON HCL 4 MG/2ML IJ SOLN
INTRAMUSCULAR | Status: DC | PRN
  Administered 2021-06-26: 4 mg via INTRAVENOUS

## 2021-06-26 MED ORDER — PROPOFOL 10 MG/ML IV BOLUS
INTRAVENOUS | Status: AC
  Filled 2021-06-26: qty 20

## 2021-06-26 MED ORDER — FENTANYL CITRATE (PF) 250 MCG/5ML IJ SOLN
INTRAMUSCULAR | Status: AC
  Filled 2021-06-26: qty 5

## 2021-06-26 MED ORDER — LIDOCAINE HCL 2 % IJ SOLN
INTRAMUSCULAR | Status: AC
  Filled 2021-06-26: qty 20

## 2021-06-26 MED ORDER — DEXAMETHASONE SODIUM PHOSPHATE 10 MG/ML IJ SOLN
INTRAMUSCULAR | Status: AC
  Filled 2021-06-26: qty 1

## 2021-06-26 SURGICAL SUPPLY — 37 items
ADH SKN CLS APL DERMABOND .7 (GAUZE/BANDAGES/DRESSINGS) ×1
APL SRG 38 LTWT LNG FL B (MISCELLANEOUS) ×1
APL SWBSTK 6 STRL LF DISP (MISCELLANEOUS) ×1
APPLICATOR ARISTA FLEXITIP XL (MISCELLANEOUS) ×3 IMPLANT
APPLICATOR COTTON TIP 6 STRL (MISCELLANEOUS) ×1 IMPLANT
APPLICATOR COTTON TIP 6IN STRL (MISCELLANEOUS) ×3
DEFOGGER SCOPE WARMER CLEARIFY (MISCELLANEOUS) ×3 IMPLANT
DERMABOND ADVANCED (GAUZE/BANDAGES/DRESSINGS) ×2
DERMABOND ADVANCED .7 DNX12 (GAUZE/BANDAGES/DRESSINGS) ×1 IMPLANT
GAUZE 4X4 16PLY ~~LOC~~+RFID DBL (SPONGE) ×3 IMPLANT
GLOVE SURG ENC TEXT LTX SZ6.5 (GLOVE) ×6 IMPLANT
GLOVE SURG POLYISO LF SZ7 (GLOVE) ×3 IMPLANT
GLOVE SURG UNDER POLY LF SZ6.5 (GLOVE) ×9 IMPLANT
GLOVE SURG UNDER POLY LF SZ7 (GLOVE) ×9 IMPLANT
GOWN STRL REUS W/TWL LRG LVL3 (GOWN DISPOSABLE) ×12 IMPLANT
HEMOSTAT ARISTA ABSORB 3G PWDR (HEMOSTASIS) ×3 IMPLANT
IV NS IRRIG 3000ML ARTHROMATIC (IV SOLUTION) ×3 IMPLANT
KIT TURNOVER CYSTO (KITS) ×3 IMPLANT
MANIFOLD NEPTUNE II (INSTRUMENTS) ×3 IMPLANT
PACK LAPAROSCOPY BASIN (CUSTOM PROCEDURE TRAY) ×3 IMPLANT
PACK TRENDGUARD 450 HYBRID PRO (MISCELLANEOUS) ×1 IMPLANT
PROTECTOR NERVE ULNAR (MISCELLANEOUS) ×6 IMPLANT
SET SUCTION IRRIG HYDROSURG (IRRIGATION / IRRIGATOR) ×3 IMPLANT
SET TUBE SMOKE EVAC HIGH FLOW (TUBING) ×3 IMPLANT
SHEARS HARMONIC ACE PLUS 36CM (ENDOMECHANICALS) ×3 IMPLANT
SUT VIC AB 4-0 PS2 27 (SUTURE) ×3 IMPLANT
SUT VICRYL 0 UR6 27IN ABS (SUTURE) ×3 IMPLANT
SUT VICRYL RAPIDE 4/0 PS 2 (SUTURE) ×6 IMPLANT
TOWEL OR 17X26 10 PK STRL BLUE (TOWEL DISPOSABLE) ×3 IMPLANT
TRAP SPECIMEN MUCUS 40CC (MISCELLANEOUS) ×3 IMPLANT
TRAY FOLEY W/BAG SLVR 14FR LF (SET/KITS/TRAYS/PACK) ×3 IMPLANT
TRENDGUARD 450 HYBRID PRO PACK (MISCELLANEOUS) ×3
TROCAR 12M 150ML BLUNT (TROCAR) ×3 IMPLANT
TROCAR BLADELESS OPT 5 100 (ENDOMECHANICALS) ×6 IMPLANT
TROCAR BLADELESS OPT 5 150 (ENDOMECHANICALS) ×3 IMPLANT
TROCAR XCEL NON-BLD 11X100MML (ENDOMECHANICALS) ×3 IMPLANT
WARMER LAPAROSCOPE (MISCELLANEOUS) ×3 IMPLANT

## 2021-06-26 NOTE — Transfer of Care (Signed)
Immediate Anesthesia Transfer of Care Note  Patient: STANA BAYON  Procedure(s) Performed: LAPAROSCOPIC LEFT OVARIAN CYSTECTOMY/ PELVIC WASHINGS (Left: Abdomen) LEFT PARTIAL OOPHORECTOMY (Left: Abdomen)  Patient Location: PACU  Anesthesia Type:General  Level of Consciousness: awake, alert , oriented and patient cooperative  Airway & Oxygen Therapy: Patient Spontanous Breathing and Patient connected to nasal cannula oxygen  Post-op Assessment: Report given to RN and Post -op Vital signs reviewed and stable  Post vital signs: Reviewed and stable  Last Vitals:  Vitals Value Taken Time  BP    Temp    Pulse 84 06/26/21 1422  Resp 15 06/26/21 1422  SpO2 100 % 06/26/21 1422  Vitals shown include unvalidated device data.  Last Pain:  Vitals:   06/26/21 0910  TempSrc: Oral  PainSc: 0-No pain         Complications: No notable events documented.

## 2021-06-26 NOTE — Op Note (Signed)
06/26/2021  2:11 PM  PATIENT:  Wilford Sports  35 y.o. female  PRE-OPERATIVE DIAGNOSIS:  Adnexal Mass (N94.89)  POST-OPERATIVE DIAGNOSIS:  Adnexal Mass (N94.89)  PROCEDURE:  Procedure(s): LAPAROSCOPIC LEFT OVARIAN CYSTECTOMY/ PELVIC WASHINGS (Left) LEFT PARTIAL OOPHORECTOMY (Left)  SURGEON:  Surgeon(s) and Role:    Gerald Leitz, MD - Primary    * Steva Ready, DO - Assisting  PHYSICIAN ASSISTANT:   ASSISTANTS: Dr. Steva Ready assisted due  complexity of the anatomy and pelvic adhesive disease    ANESTHESIA:   general  EBL:  100 mL   BLOOD ADMINISTERED:none  DRAINS: Urinary Catheter (Foley)   LOCAL MEDICATIONS USED:  MARCAINE     SPECIMEN:  Source of Specimen:  left ovarian cyst suspected endometrioma and portion of the left ovary  DISPOSITION OF SPECIMEN:  PATHOLOGY  COUNTS:  YES  TOURNIQUET:  * No tourniquets in log *  DICTATION: .Note written in EPIC  PLAN OF CARE: Discharge to home after PACU  PATIENT DISPOSITION:  PACU - hemodynamically stable.   Delay start of Pharmacological VTE agent (>24hrs) due to surgical blood loss or risk of bleeding: not applicable  Findings: Large multicystic mass on the left ovary consistent with an endometrioma.   Procedure:The patient was taken to the operating room placed under general anesthesia. Time Out was performed.  She was  Prepped and draped in the normal sterile fashion. A foley catheter was placed. A Uterine manipulator was placed.  Attention was turned to the abdomen where the umbilicus was injected with 10 cc of marcaine. Due to difficulty placing the 10 mm umbilical trocar and concern for adhesions at the umbilicus a 5 mm  left upper quadrant incision was made and a 5 mm trocar was placed under direct visualization. Pneumoperioneum was achieved with CO2 gas. A 10 mm trocar was placed under direct visualization.  . A 5 mm trocar was placed in the right and left lower quadrants. Each trocar site was injected with  10 cc of marcaine prior to trocar placement. The harmonic scalpel was used transect  across the bass of the cyst. An endo catch bag was placed through the 10 mm umbilical port. The specimen was placed in the bag and removed through the umbilical incision. Pneumoperitoneum was reestablished. Pt was noted to have bleeding from the left pelvic side wall this was controlled with the Kleppinger.  The pelvis was irrigated. Excellent hemostasis was noted. All trocars were removed under direct visualization . The pneumoperitoneum was released.  The fascia of the umbilical incision was re approximated with 0 vicryl. The skin incisions were closed with 4-0 vicryl and derma bond.  the patient was taken to the recovery room awake and in stable condition.  Sponge lap and needle counts were correct times 2.

## 2021-06-26 NOTE — Discharge Instructions (Addendum)
Post Anesthesia Home Care Instructions  Activity: Get plenty of rest for the remainder of the day. A responsible adult should stay with you for 24 hours following the procedure.  For the next 24 hours, DO NOT: -Drive a car -Operate machinery -Drink alcoholic beverages -Take any medication unless instructed by your physician -Make any legal decisions or sign important papers.  Meals: Start with liquid foods such as gelatin or soup. Progress to regular foods as tolerated. Avoid greasy, spicy, heavy foods. If nausea and/or vomiting occur, drink only clear liquids until the nausea and/or vomiting subsides. Call your physician if vomiting continues.  Special Instructions/Symptoms: Your throat may feel dry or sore from the anesthesia or the breathing tube placed in your throat during surgery. If this causes discomfort, gargle with warm salt water. The discomfort should disappear within 24 hours.  If you had a scopolamine patch placed behind your ear for the management of post- operative nausea and/or vomiting:  1. The medication in the patch is effective for 72 hours, after which it should be removed.  Wrap patch in a tissue and discard in the trash. Wash hands thoroughly with soap and water. 2. You may remove the patch earlier than 72 hours if you experience unpleasant side effects which may include dry mouth, dizziness or visual disturbances. 3. Avoid touching the patch. Wash your hands with soap and water after contact with the patch.   DISCHARGE INSTRUCTIONS: Laparoscopy  The following instructions have been prepared to help you care for yourself upon your return home today.  Wound care: . Do not get the incision wet for the first 24 hours. The incision should be kept clean and dry. . The Band-Aids or dressings may be removed the day after surgery. . Should the incision become sore, red, and swollen after the first week, check with your doctor.  Personal hygiene: . Shower the day  after your procedure.  Activity and limitations: . Do NOT drive or operate any equipment today. . Do NOT lift anything more than 15 pounds for 2-3 weeks after surgery. . Do NOT rest in bed all day. . Walking is encouraged. Walk each day, starting slowly with 5-minute walks 3 or 4 times a day. Slowly increase the length of your walks. . Walk up and down stairs slowly. . Do NOT do strenuous activities, such as golfing, playing tennis, bowling, running, biking, weight lifting, gardening, mowing, or vacuuming for 2-4 weeks. Ask your doctor when it is okay to start.  Diet: Eat a light meal as desired this evening. You may resume your usual diet tomorrow.  Return to work: This is dependent on the type of work you do. For the most part you can return to a desk job within a week of surgery. If you are more active at work, please discuss this with your doctor.  What to expect after your surgery: You may have a slight burning sensation when you urinate on the first day. You may have a very small amount of blood in the urine. Expect to have a small amount of vaginal discharge/light bleeding for 1-2 weeks. It is not unusual to have abdominal soreness and bruising for up to 2 weeks. You may be tired and need more rest for about 1 week. You may experience shoulder pain for 24-72 hours. Lying flat in bed may relieve it.  Call your doctor for any of the following: . Develop a fever of 100.4 or greater . Inability to urinate 6 hours after discharge   from hospital . Severe pain not relieved by pain medications . Persistent of heavy bleeding at incision site . Redness or swelling around incision site after a week . Increasing nausea or vomiting   

## 2021-06-26 NOTE — Anesthesia Procedure Notes (Addendum)
Procedure Name: Intubation Date/Time: 06/26/2021 11:46 AM Performed by: Mechele Claude, CRNA Pre-anesthesia Checklist: Patient identified, Emergency Drugs available, Suction available, Patient being monitored and Timeout performed Patient Re-evaluated:Patient Re-evaluated prior to induction Oxygen Delivery Method: Circle system utilized Preoxygenation: Pre-oxygenation with 100% oxygen Induction Type: IV induction Ventilation: Mask ventilation without difficulty Laryngoscope Size: Mac and 4 Tube type: Oral Tube size: 7.0 mm Number of attempts: 1 Airway Equipment and Method: Stylet and Oral airway Placement Confirmation: ETT inserted through vocal cords under direct vision, positive ETCO2, breath sounds checked- equal and bilateral and CO2 detector Tube secured with: Tape Dental Injury: Teeth and Oropharynx as per pre-operative assessment  Comments: Intubation performed by Wyvonna Plum CRNA

## 2021-06-26 NOTE — Anesthesia Preprocedure Evaluation (Signed)
Anesthesia Evaluation  Patient identified by MRN, date of birth, ID band Patient awake    Reviewed: Allergy & Precautions, NPO status , Patient's Chart, lab work & pertinent test results  Airway Mallampati: II  TM Distance: >3 FB Neck ROM: Full    Dental  (+) Teeth Intact, Dental Advisory Given   Pulmonary neg pulmonary ROS,    Pulmonary exam normal breath sounds clear to auscultation       Cardiovascular negative cardio ROS Normal cardiovascular exam Rhythm:Regular Rate:Normal     Neuro/Psych negative neurological ROS     GI/Hepatic negative GI ROS, Neg liver ROS,   Endo/Other  negative endocrine ROSObesity   Renal/GU negative Renal ROS     Musculoskeletal  (+) Arthritis ,   Abdominal   Peds  Hematology  (+) Blood dyscrasia, anemia ,   Anesthesia Other Findings Day of surgery medications reviewed with the patient.  Reproductive/Obstetrics                             Anesthesia Physical Anesthesia Plan  ASA: 2  Anesthesia Plan: General   Post-op Pain Management:    Induction: Intravenous  PONV Risk Score and Plan: 4 or greater and Scopolamine patch - Pre-op, Midazolam, Dexamethasone and Ondansetron  Airway Management Planned: Oral ETT  Additional Equipment:   Intra-op Plan:   Post-operative Plan: Extubation in OR  Informed Consent: I have reviewed the patients History and Physical, chart, labs and discussed the procedure including the risks, benefits and alternatives for the proposed anesthesia with the patient or authorized representative who has indicated his/her understanding and acceptance.     Dental advisory given  Plan Discussed with: CRNA  Anesthesia Plan Comments:         Anesthesia Quick Evaluation

## 2021-06-26 NOTE — Anesthesia Procedure Notes (Addendum)
Procedure Name: Intubation Date/Time: 06/26/2021 11:46 AM Performed by: Mechele Claude, CRNA Pre-anesthesia Checklist: Patient identified, Emergency Drugs available, Suction available and Patient being monitored Patient Re-evaluated:Patient Re-evaluated prior to induction Oxygen Delivery Method: Circle system utilized Preoxygenation: Pre-oxygenation with 100% oxygen Induction Type: IV induction Ventilation: Mask ventilation without difficulty Laryngoscope Size: Mac and 4 Grade View: Grade I Tube type: Oral Tube size: 7.0 mm Number of attempts: 1 Airway Equipment and Method: Stylet and Oral airway Placement Confirmation: ETT inserted through vocal cords under direct vision, positive ETCO2 and breath sounds checked- equal and bilateral Secured at: 22 cm Tube secured with: Tape Dental Injury: Teeth and Oropharynx as per pre-operative assessment

## 2021-06-26 NOTE — H&P (Signed)
Date of Initial H&P: 06/25/2021  History reviewed, patient examined, no change in status, stable for surgery.  

## 2021-06-27 ENCOUNTER — Encounter (HOSPITAL_BASED_OUTPATIENT_CLINIC_OR_DEPARTMENT_OTHER): Payer: Self-pay | Admitting: Obstetrics and Gynecology

## 2021-06-27 NOTE — Anesthesia Postprocedure Evaluation (Signed)
Anesthesia Post Note  Patient: Kathryn Hardy  Procedure(s) Performed: LAPAROSCOPIC LEFT OVARIAN CYSTECTOMY/ PELVIC WASHINGS (Left: Abdomen) LEFT PARTIAL OOPHORECTOMY (Left: Abdomen)     Patient location during evaluation: PACU Anesthesia Type: General Level of consciousness: awake and alert Pain management: pain level controlled Vital Signs Assessment: post-procedure vital signs reviewed and stable Respiratory status: spontaneous breathing, nonlabored ventilation, respiratory function stable and patient connected to nasal cannula oxygen Cardiovascular status: blood pressure returned to baseline and stable Postop Assessment: no apparent nausea or vomiting Anesthetic complications: no   No notable events documented.  Last Vitals:  Vitals:   06/26/21 1534 06/26/21 1552  BP:  132/86  Pulse: 84 90  Resp: 14 12  Temp: 36.4 C 36.5 C  SpO2: 100% 100%    Last Pain:  Vitals:   06/26/21 1630  TempSrc:   PainSc: 7    Pain Goal:                   Cecile Hearing

## 2021-06-27 NOTE — Addendum Note (Signed)
Addendum  created 06/27/21 1444 by Earmon Phoenix, CRNA   Charge Capture section accepted

## 2021-06-28 LAB — CYTOLOGY - NON PAP

## 2021-06-28 LAB — SURGICAL PATHOLOGY

## 2024-08-04 ENCOUNTER — Ambulatory Visit (HOSPITAL_COMMUNITY)
Admission: RE | Admit: 2024-08-04 | Discharge: 2024-08-04 | Disposition: A | Discharge status: Home / Self Care | Source: Ambulatory Visit | Attending: Vascular Surgery | Admitting: Vascular Surgery

## 2024-08-04 ENCOUNTER — Other Ambulatory Visit (HOSPITAL_COMMUNITY): Payer: Self-pay | Admitting: Family Medicine

## 2024-08-04 DIAGNOSIS — M79605 Pain in left leg: Secondary | ICD-10-CM | POA: Insufficient documentation
